# Patient Record
Sex: Female | Born: 1970 | Race: White | Hispanic: No | Marital: Single | Smoking: Current every day smoker
Health system: Southern US, Community
[De-identification: ages and names within clinical notes are randomized; demographics above are authoritative.]

## PROBLEM LIST (undated history)

## (undated) ENCOUNTER — Encounter
Attending: Student in an Organized Health Care Education/Training Program | Primary: Student in an Organized Health Care Education/Training Program

## (undated) ENCOUNTER — Encounter

## (undated) ENCOUNTER — Ambulatory Visit

## (undated) ENCOUNTER — Telehealth

## (undated) ENCOUNTER — Telehealth
Attending: Student in an Organized Health Care Education/Training Program | Primary: Student in an Organized Health Care Education/Training Program

## (undated) ENCOUNTER — Ambulatory Visit: Payer: PRIVATE HEALTH INSURANCE

## (undated) ENCOUNTER — Encounter: Attending: Internal Medicine | Primary: Internal Medicine

## (undated) ENCOUNTER — Encounter
Attending: Pharmacist Clinician (PhC)/ Clinical Pharmacy Specialist | Primary: Pharmacist Clinician (PhC)/ Clinical Pharmacy Specialist

## (undated) ENCOUNTER — Encounter: Attending: Family | Primary: Family

## (undated) ENCOUNTER — Encounter
Payer: PRIVATE HEALTH INSURANCE | Attending: Student in an Organized Health Care Education/Training Program | Primary: Student in an Organized Health Care Education/Training Program

## (undated) ENCOUNTER — Encounter: Payer: PRIVATE HEALTH INSURANCE | Attending: Family | Primary: Family

## (undated) ENCOUNTER — Ambulatory Visit: Payer: PRIVATE HEALTH INSURANCE | Attending: Family | Primary: Family

## (undated) ENCOUNTER — Ambulatory Visit
Attending: Student in an Organized Health Care Education/Training Program | Primary: Student in an Organized Health Care Education/Training Program

## (undated) ENCOUNTER — Telehealth
Attending: Pharmacist Clinician (PhC)/ Clinical Pharmacy Specialist | Primary: Pharmacist Clinician (PhC)/ Clinical Pharmacy Specialist

## (undated) ENCOUNTER — Ambulatory Visit
Attending: Pharmacist Clinician (PhC)/ Clinical Pharmacy Specialist | Primary: Pharmacist Clinician (PhC)/ Clinical Pharmacy Specialist

## (undated) ENCOUNTER — Ambulatory Visit
Payer: PRIVATE HEALTH INSURANCE | Attending: Student in an Organized Health Care Education/Training Program | Primary: Student in an Organized Health Care Education/Training Program

## (undated) ENCOUNTER — Encounter: Attending: Gastroenterology | Primary: Gastroenterology

## (undated) ENCOUNTER — Telehealth: Attending: Family | Primary: Family

## (undated) ENCOUNTER — Inpatient Hospital Stay

## (undated) DIAGNOSIS — T7840XA Allergy, unspecified, initial encounter: Secondary | ICD-10-CM

## (undated) DIAGNOSIS — G4700 Insomnia, unspecified: Secondary | ICD-10-CM

## (undated) DIAGNOSIS — J449 Chronic obstructive pulmonary disease, unspecified: Secondary | ICD-10-CM

## (undated) DIAGNOSIS — F419 Anxiety disorder, unspecified: Secondary | ICD-10-CM

## (undated) DIAGNOSIS — F4310 Post-traumatic stress disorder, unspecified: Secondary | ICD-10-CM

## (undated) DIAGNOSIS — K219 Gastro-esophageal reflux disease without esophagitis: Secondary | ICD-10-CM

## (undated) DIAGNOSIS — J42 Unspecified chronic bronchitis: Secondary | ICD-10-CM

## (undated) HISTORY — DX: Gastro-esophageal reflux disease without esophagitis: K21.9

## (undated) HISTORY — DX: Insomnia, unspecified: G47.00

## (undated) HISTORY — DX: Unspecified chronic bronchitis: J42

## (undated) HISTORY — PX: ABDOMINAL HYSTERECTOMY: SHX81

## (undated) HISTORY — DX: Allergy, unspecified, initial encounter: T78.40XA

---
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
## 2002-02-27 HISTORY — PX: SALPINGOOPHORECTOMY: SHX82

## 2003-02-28 HISTORY — PX: ABDOMINAL HYSTERECTOMY: SHX81

## 2007-07-08 ENCOUNTER — Ambulatory Visit: Payer: Self-pay | Admitting: Internal Medicine

## 2008-08-27 DIAGNOSIS — Z8619 Personal history of other infectious and parasitic diseases: Secondary | ICD-10-CM | POA: Insufficient documentation

## 2008-12-08 ENCOUNTER — Emergency Department: Payer: Self-pay | Admitting: Emergency Medicine

## 2009-06-14 IMAGING — CR NASAL BONES - 3+ VIEW
1 series · 3 of 3 positions shown · non-contrast
Comparison: none

REASON FOR EXAM: Fall
COMMENTS:

PROCEDURE:   MDR NASAL BONES  July 07, 2004   [DATE]
RESULT:     There is a minimally displaced fracture of the anterior aspect
of the nasal bridge. No other fractures are seen. The paranasal sinuses are
clear.

[Series 1: view not recorded · 0.17mm/px · 3 of 3 slices shown]
[im 1/3]
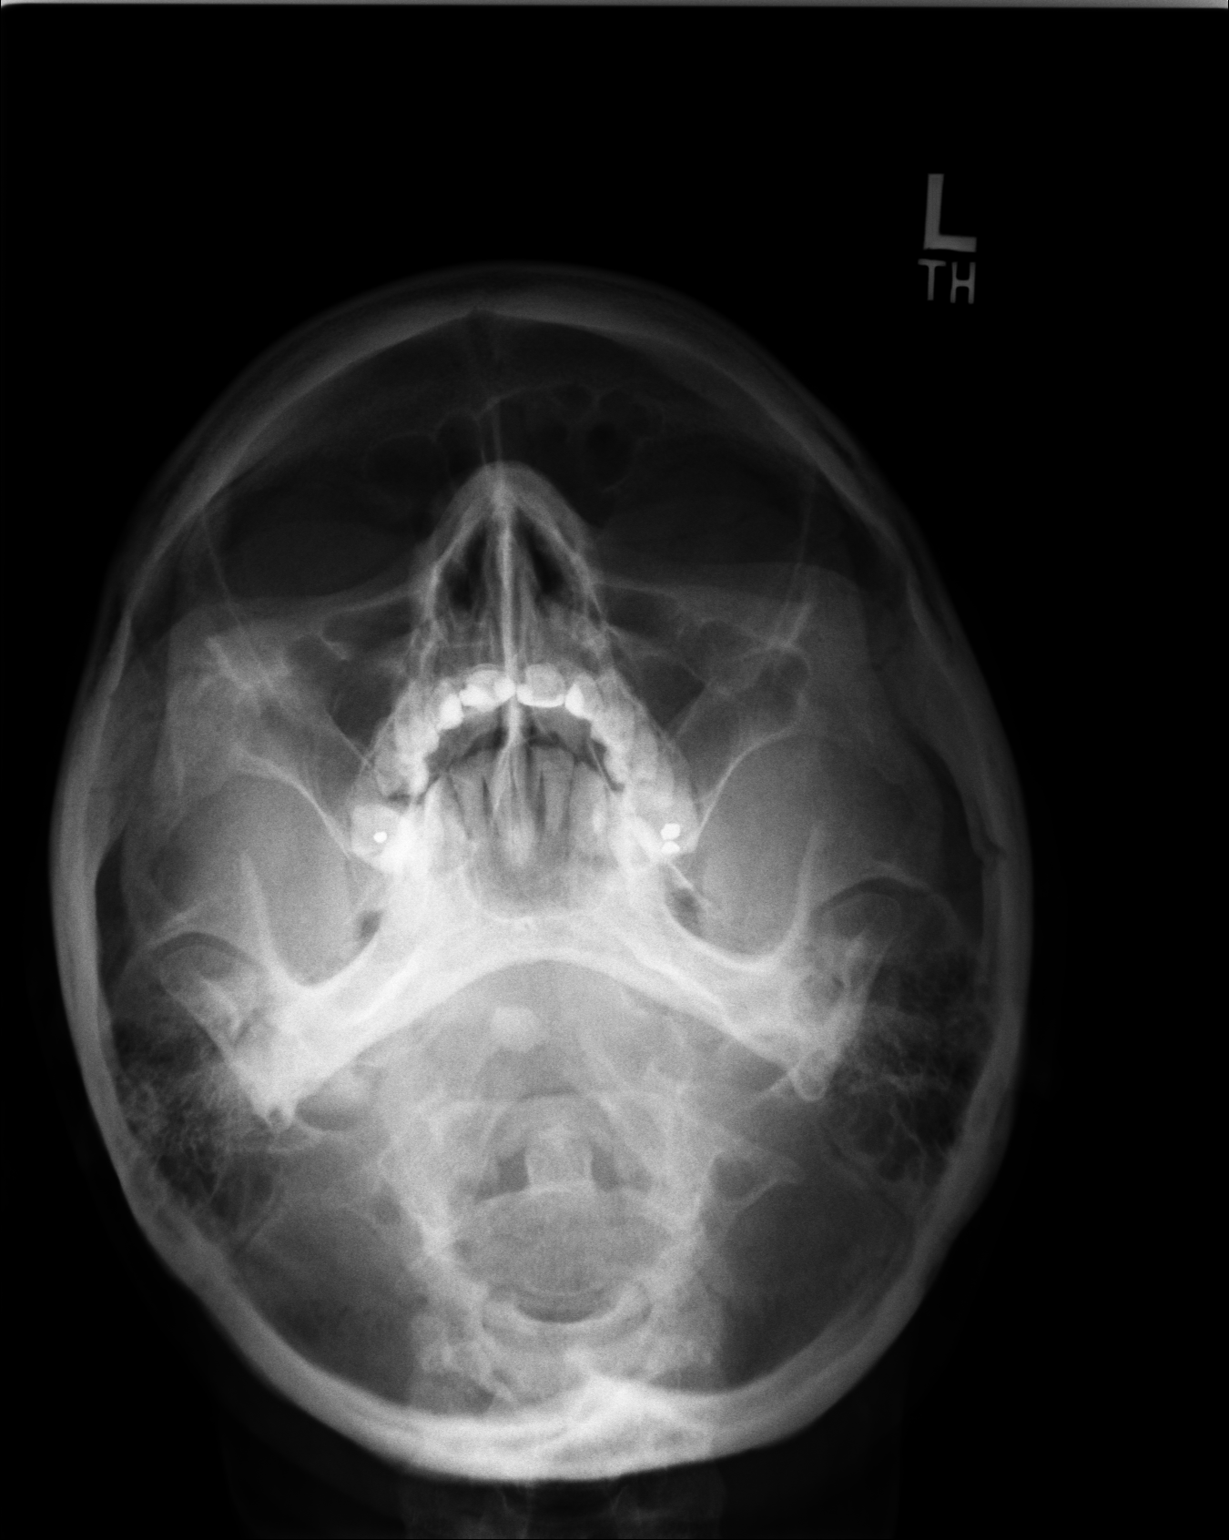
[im 2/3]
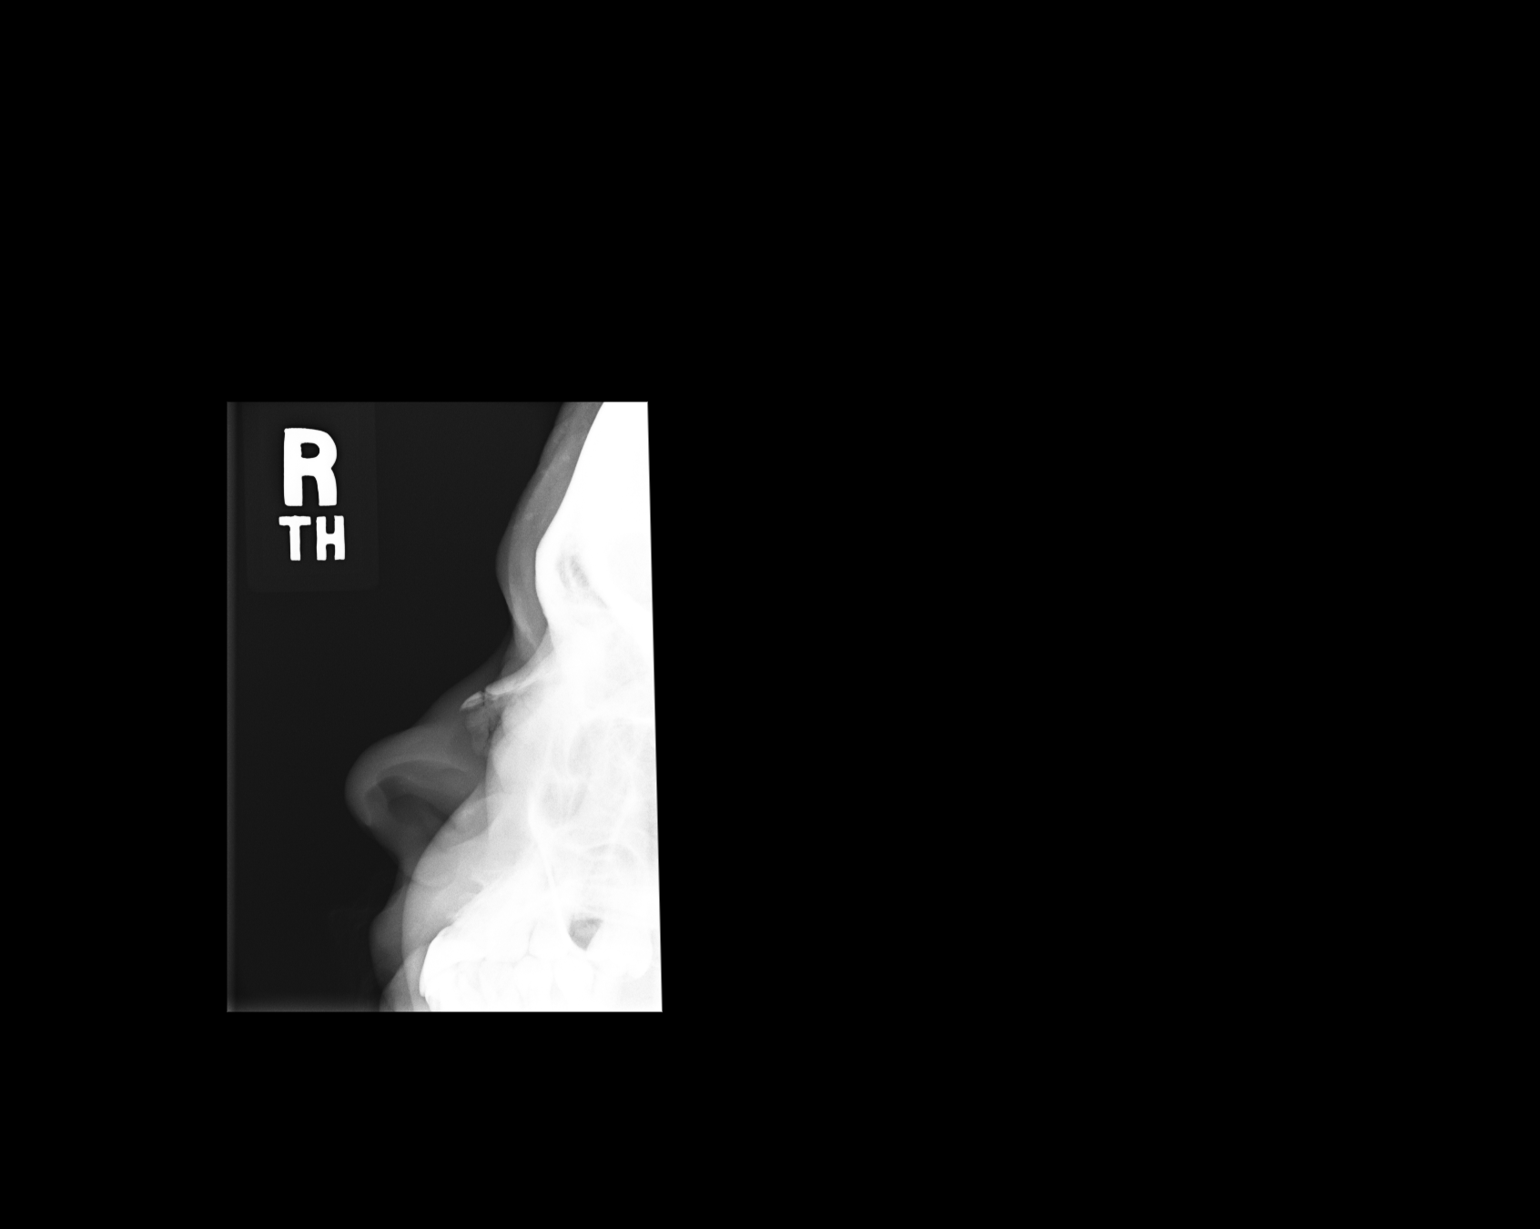
[im 3/3]
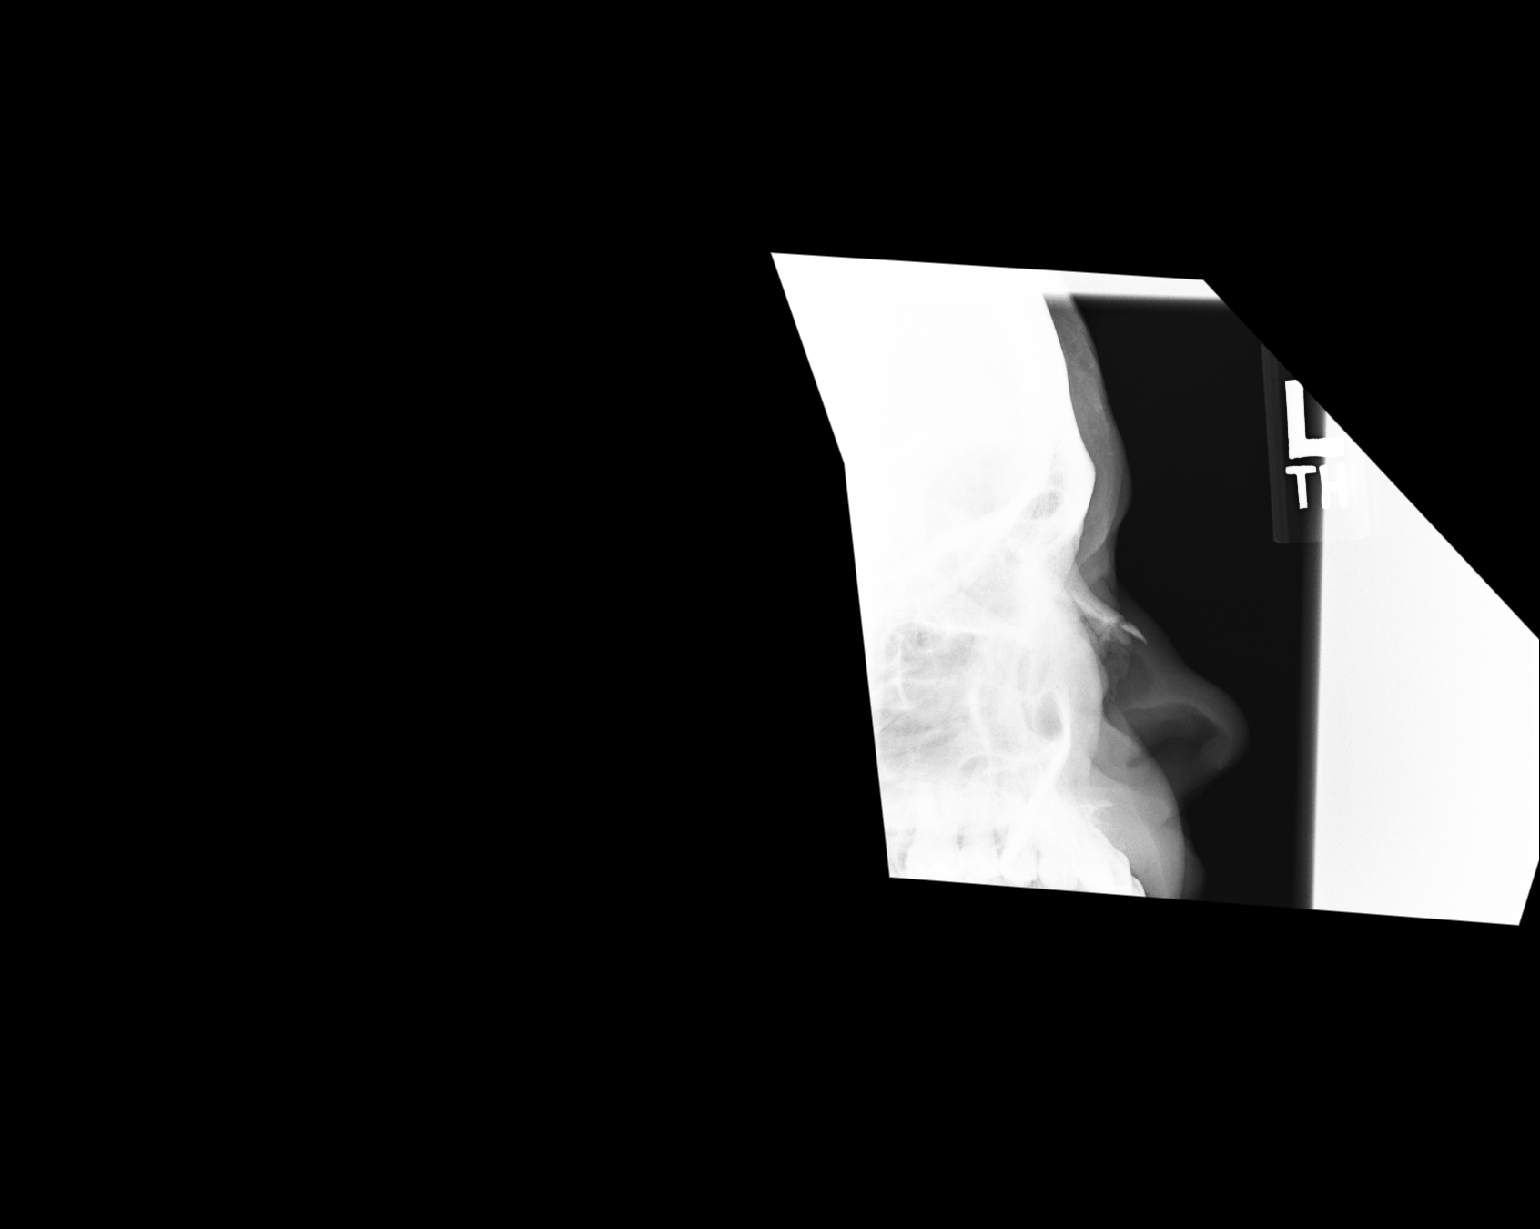

[3 of 3 positions shown; findings below may reference images not displayed]

IMPRESSION: Fracture of the nasal bridge, minimally displaced.

## 2010-04-09 ENCOUNTER — Ambulatory Visit: Payer: Self-pay | Admitting: Internal Medicine

## 2010-07-28 DIAGNOSIS — F32A Depression, unspecified: Secondary | ICD-10-CM | POA: Insufficient documentation

## 2010-07-28 DIAGNOSIS — Z8619 Personal history of other infectious and parasitic diseases: Secondary | ICD-10-CM | POA: Insufficient documentation

## 2010-07-28 DIAGNOSIS — B182 Chronic viral hepatitis C: Secondary | ICD-10-CM | POA: Insufficient documentation

## 2010-07-28 DIAGNOSIS — F17200 Nicotine dependence, unspecified, uncomplicated: Secondary | ICD-10-CM | POA: Insufficient documentation

## 2010-07-28 DIAGNOSIS — K2970 Gastritis, unspecified, without bleeding: Secondary | ICD-10-CM | POA: Insufficient documentation

## 2010-08-09 ENCOUNTER — Ambulatory Visit: Payer: Self-pay | Admitting: Internal Medicine

## 2010-11-12 ENCOUNTER — Ambulatory Visit: Payer: Self-pay | Admitting: Internal Medicine

## 2011-05-08 ENCOUNTER — Ambulatory Visit: Payer: Self-pay

## 2011-05-08 LAB — CBC WITH DIFFERENTIAL/PLATELET
Eosinophil #: 0.1 10*3/uL (ref 0.0–0.7)
Eosinophil %: 2.3 %
Lymphocyte #: 2.2 10*3/uL (ref 1.0–3.6)
Lymphocyte %: 39.7 %
MCH: 31.8 pg (ref 26.0–34.0)
Monocyte #: 0.3 10*3/uL (ref 0.0–0.7)
Monocyte %: 6.2 %
Neutrophil %: 50.6 %
Platelet: 273 10*3/uL (ref 150–440)
RBC: 4.37 10*6/uL (ref 3.80–5.20)
RDW: 11.9 % (ref 11.5–14.5)
WBC: 5.4 10*3/uL (ref 3.6–11.0)

## 2011-05-08 LAB — COMPREHENSIVE METABOLIC PANEL
Alkaline Phosphatase: 108 U/L (ref 50–136)
Anion Gap: 5 — ABNORMAL LOW (ref 7–16)
BUN: 18 mg/dL (ref 7–18)
Creatinine: 1.02 mg/dL (ref 0.60–1.30)
EGFR (African American): >60
EGFR (Non-African Amer.): >60
Glucose: 97 mg/dL (ref 65–99)
Osmolality: 277 (ref 275–301)
SGPT (ALT): 47 U/L
Sodium: 138 mmol/L (ref 136–145)
Total Protein: 7.5 g/dL (ref 6.4–8.2)

## 2011-05-08 LAB — LIPASE, BLOOD: Lipase: 163 U/L (ref 73–393)

## 2011-05-26 DIAGNOSIS — Z9151 Personal history of suicidal behavior: Secondary | ICD-10-CM | POA: Insufficient documentation

## 2011-05-26 DIAGNOSIS — F489 Nonpsychotic mental disorder, unspecified: Secondary | ICD-10-CM | POA: Insufficient documentation

## 2011-05-26 DIAGNOSIS — J449 Chronic obstructive pulmonary disease, unspecified: Secondary | ICD-10-CM | POA: Insufficient documentation

## 2011-05-26 DIAGNOSIS — J439 Emphysema, unspecified: Secondary | ICD-10-CM | POA: Insufficient documentation

## 2011-05-26 DIAGNOSIS — T719XXA Asphyxiation due to unspecified cause, initial encounter: Secondary | ICD-10-CM | POA: Insufficient documentation

## 2012-01-28 ENCOUNTER — Ambulatory Visit: Payer: Self-pay

## 2012-02-10 ENCOUNTER — Emergency Department: Payer: Self-pay | Admitting: Emergency Medicine

## 2012-02-28 HISTORY — PX: SALPINGOOPHORECTOMY: SHX82

## 2012-09-16 DIAGNOSIS — N838 Other noninflammatory disorders of ovary, fallopian tube and broad ligament: Secondary | ICD-10-CM | POA: Insufficient documentation

## 2012-10-23 ENCOUNTER — Ambulatory Visit: Payer: Self-pay | Admitting: Physician Assistant

## 2012-10-23 LAB — URINALYSIS, COMPLETE
Glucose,UR: NEGATIVE mg/dL (ref 0–75)
Ketone: NEGATIVE
Nitrite: NEGATIVE
RBC,UR: >30 /HPF (ref 0–5)
Specific Gravity: 1.02 (ref 1.003–1.030)
WBC UR: >30 /HPF (ref 0–5)

## 2013-02-03 ENCOUNTER — Ambulatory Visit: Payer: Self-pay

## 2014-01-17 IMAGING — CR RIGHT TIBIA AND FIBULA - 2 VIEW
1 series · 2 of 2 positions shown · non-contrast
Comparison: none

REASON FOR EXAM: fall large bruising
COMMENTS:

RESULT:     Images of the right tibia and fibula demonstrate no fracture,
dislocation or radiopaque foreign body. No periosteal elevation or reaction
is seen.

[Series 1: ap · 0.17mm/px · 2 of 2 slices shown]
[im 1/2]
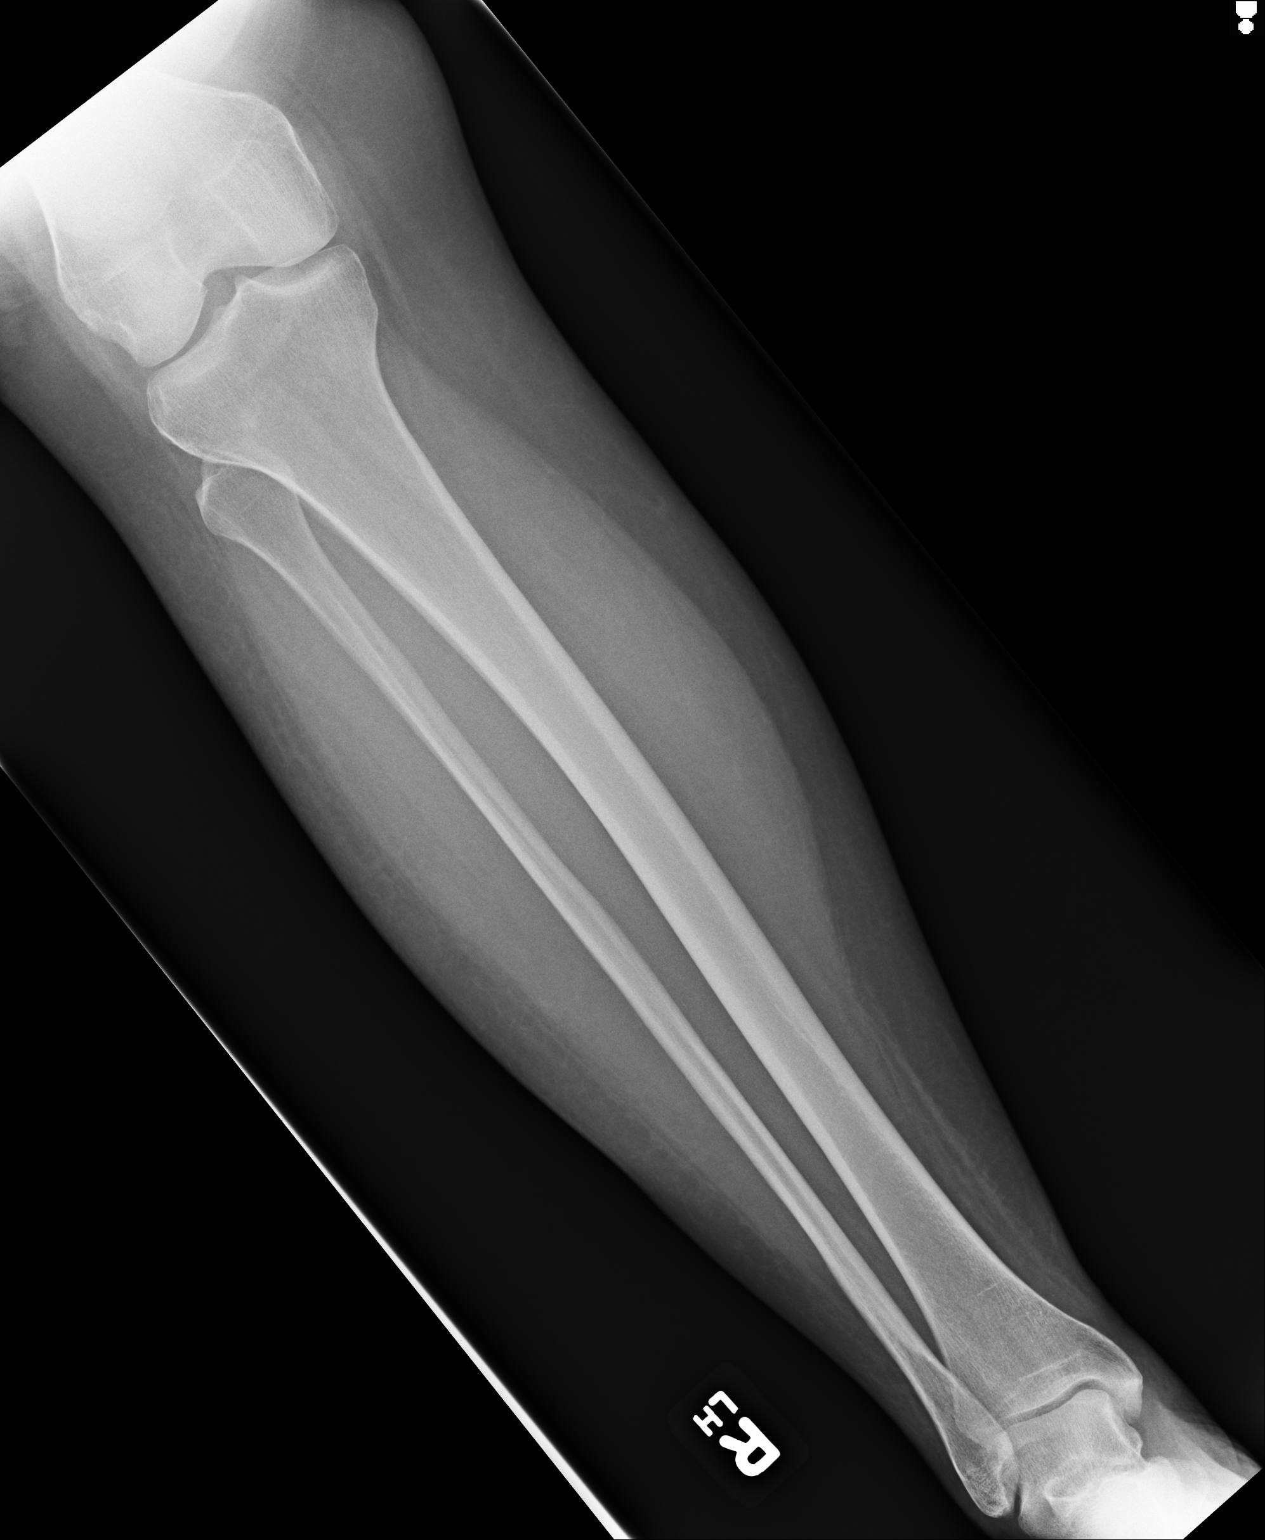
[im 2/2]
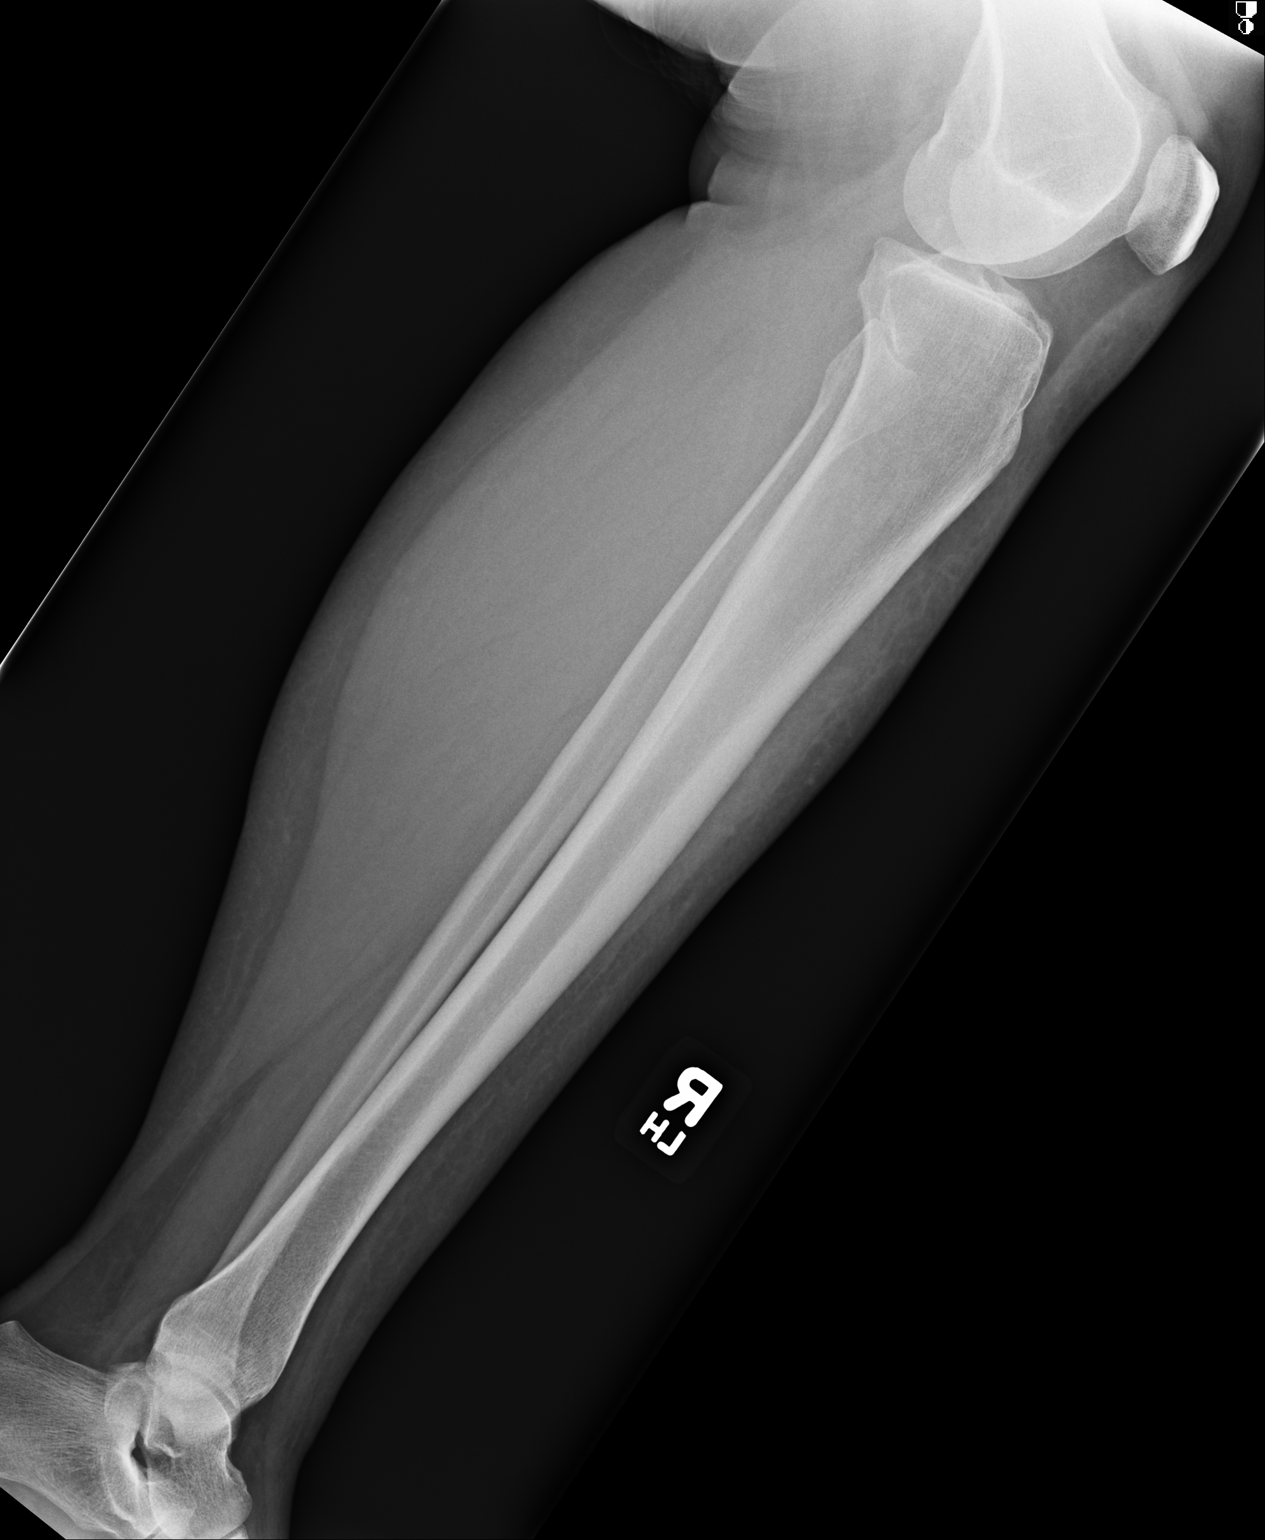

[2 of 2 positions shown; findings below may reference images not displayed]

IMPRESSION: Please see above.

[REDACTED]

## 2014-01-17 IMAGING — CR RIGHT FOOT COMPLETE - 3+ VIEW
1 series · 3 of 3 positions shown · non-contrast
Comparison: none

REASON FOR EXAM: fall
COMMENTS:

PROCEDURE:     DXR - DXR FOOT RT COMPLETE W/OBLIQUES  - February 10, 2012  [DATE]
RESULT:     Images of the right foot show no definite fracture, dislocation
or radiopaque foreign body.

[Series 1: ap · 0.17mm/px · 3 of 3 slices shown]
[im 1/3]
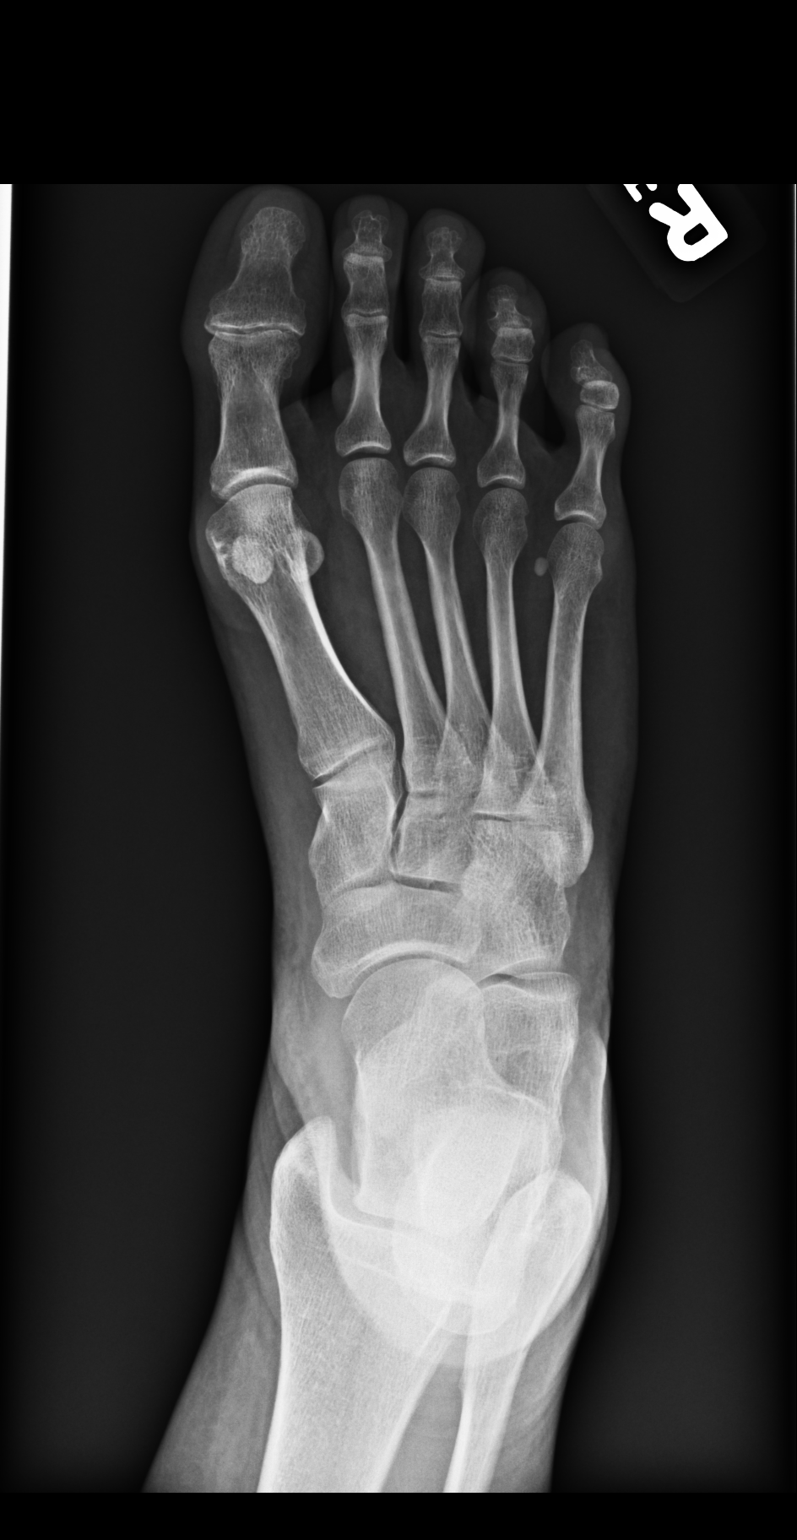
[im 2/3]
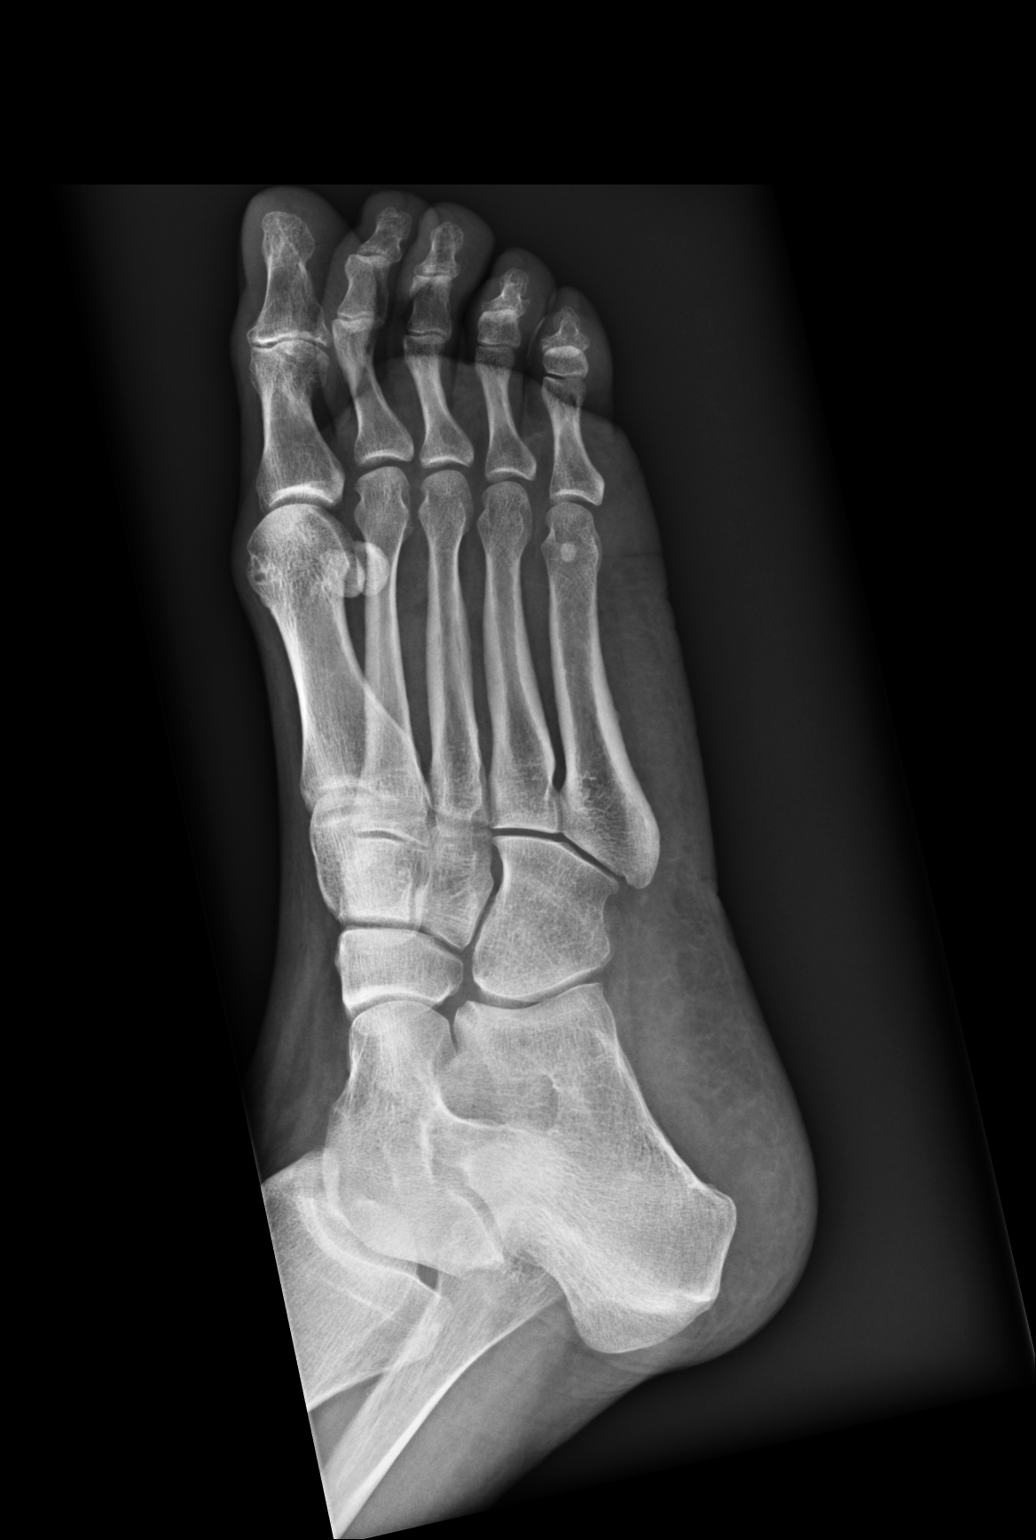
[im 3/3]
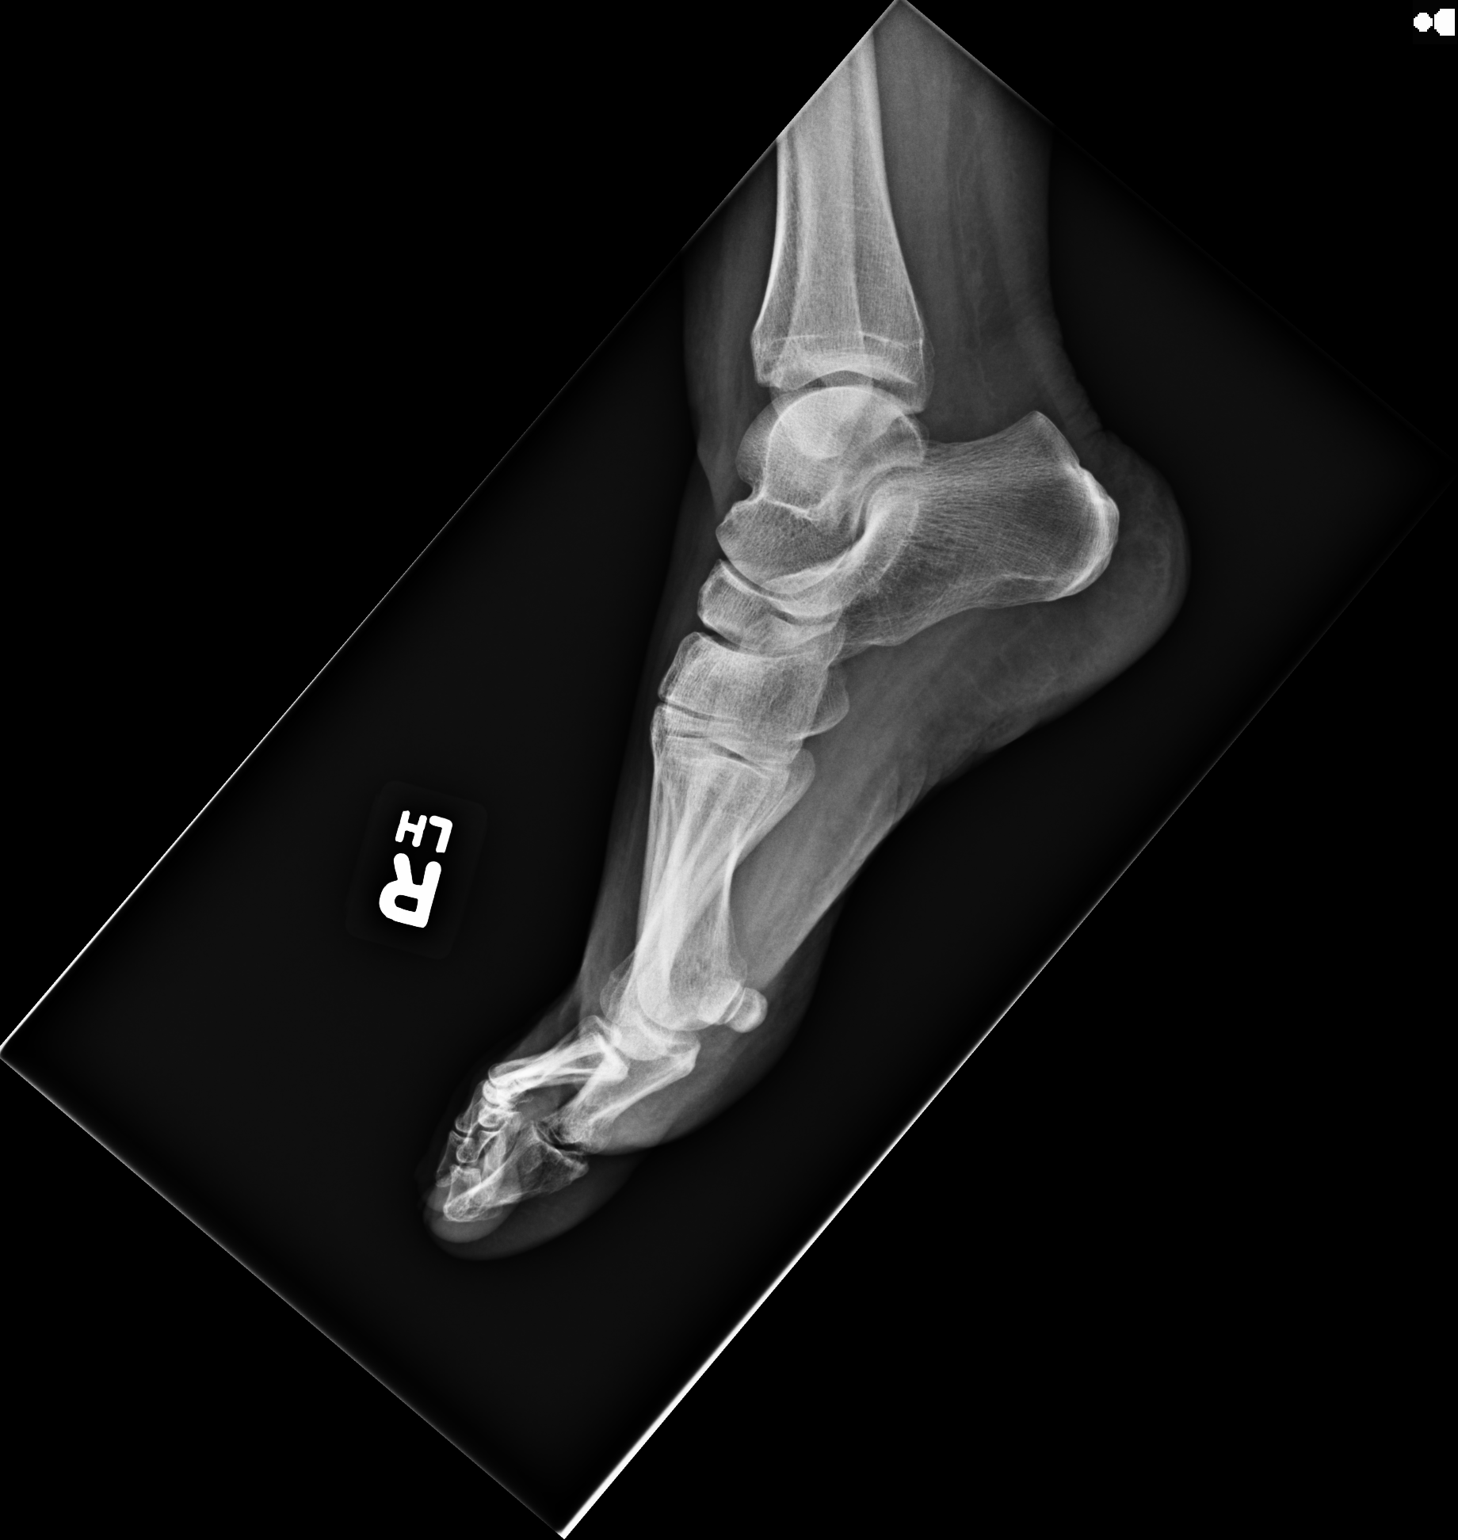

[3 of 3 positions shown; findings below may reference images not displayed]

IMPRESSION: No acute bony abnormality evident.

[REDACTED]

## 2014-08-19 LAB — HM COLONOSCOPY

## 2016-09-11 MED ORDER — RABEPRAZOLE 20 MG TABLET,DELAYED RELEASE
ORAL_TABLET | Freq: Two times a day (BID) | ORAL | 3 refills | 0 days | Status: CP
Start: 2016-09-11 — End: 2018-02-08

## 2016-10-26 ENCOUNTER — Emergency Department
Admission: EM | Admit: 2016-10-26 | Discharge: 2016-10-26 | Disposition: A | Discharge status: Home / Self Care | Payer: MEDICAID | Source: Intra-hospital

## 2016-10-26 ENCOUNTER — Emergency Department
Admission: EM | Admit: 2016-10-26 | Discharge: 2016-10-26 | Disposition: A | Discharge status: Home / Self Care | Source: Intra-hospital

## 2016-10-26 DIAGNOSIS — M549 Dorsalgia, unspecified: Principal | ICD-10-CM

## 2016-10-26 MED ORDER — IBUPROFEN 800 MG TABLET
ORAL_TABLET | Freq: Three times a day (TID) | ORAL | 0 refills | 0.00000 days | Status: CP | PRN
Start: 2016-10-26 — End: 2017-08-22

## 2016-10-26 MED ORDER — LIDOCAINE 5 % TOPICAL PATCH
MEDICATED_PATCH | TRANSDERMAL | 0 refills | 0.00000 days | Status: CP
Start: 2016-10-26 — End: 2017-08-22

## 2016-11-06 MED ORDER — CETIRIZINE 10 MG TABLET
ORAL_TABLET | 11 refills | 0 days | Status: CP
Start: 2016-11-06 — End: 2017-07-19

## 2016-12-04 MED ORDER — IBUPROFEN 600 MG TABLET
ORAL_TABLET | 0 refills | 0 days | Status: CP
Start: 2016-12-04 — End: 2017-03-20

## 2017-03-21 MED ORDER — IBUPROFEN 600 MG TABLET
ORAL_TABLET | Freq: Three times a day (TID) | ORAL | 0 refills | 0 days | Status: CP | PRN
Start: 2017-03-21 — End: 2017-06-04

## 2017-05-01 MED ORDER — CYCLOBENZAPRINE 10 MG TABLET
ORAL_TABLET | 0 refills | 0 days | Status: CP
Start: 2017-05-01 — End: 2017-06-04

## 2017-06-05 MED ORDER — IBUPROFEN 600 MG TABLET
ORAL_TABLET | 0 refills | 0 days | Status: CP
Start: 2017-06-05 — End: 2017-07-22

## 2017-06-05 MED ORDER — CYCLOBENZAPRINE 10 MG TABLET
ORAL_TABLET | 0 refills | 0 days | Status: CP
Start: 2017-06-05 — End: 2017-07-19

## 2017-07-03 ENCOUNTER — Encounter
Admit: 2017-07-03 | Discharge: 2017-07-03 | Payer: PRIVATE HEALTH INSURANCE | Attending: Internal Medicine | Primary: Internal Medicine

## 2017-07-03 ENCOUNTER — Encounter: Admit: 2017-07-03 | Discharge: 2017-07-03 | Payer: PRIVATE HEALTH INSURANCE

## 2017-07-03 DIAGNOSIS — W19XXXD Unspecified fall, subsequent encounter: Secondary | ICD-10-CM

## 2017-07-03 DIAGNOSIS — B1920 Unspecified viral hepatitis C without hepatic coma: Principal | ICD-10-CM

## 2017-07-03 DIAGNOSIS — J449 Chronic obstructive pulmonary disease, unspecified: Secondary | ICD-10-CM

## 2017-07-03 MED ORDER — TIOTROPIUM BROMIDE 18 MCG CAPSULE WITH INHALATION DEVICE
ORAL_CAPSULE | Freq: Every day | RESPIRATORY_TRACT | 3 refills | 0 days | Status: CP
Start: 2017-07-03 — End: ?

## 2017-07-03 MED ORDER — DICLOFENAC 1 % TOPICAL GEL
Freq: Four times a day (QID) | TOPICAL | 0 refills | 0 days | Status: CP
Start: 2017-07-03 — End: 2018-07-03

## 2017-07-19 ENCOUNTER — Encounter
Admit: 2017-07-19 | Discharge: 2017-07-19 | Payer: PRIVATE HEALTH INSURANCE | Attending: Student in an Organized Health Care Education/Training Program | Primary: Student in an Organized Health Care Education/Training Program

## 2017-07-19 DIAGNOSIS — K2990 Gastroduodenitis, unspecified, without bleeding: Secondary | ICD-10-CM

## 2017-07-19 DIAGNOSIS — Z9189 Other specified personal risk factors, not elsewhere classified: Principal | ICD-10-CM

## 2017-07-19 DIAGNOSIS — J302 Other seasonal allergic rhinitis: Secondary | ICD-10-CM

## 2017-07-19 DIAGNOSIS — R1033 Periumbilical pain: Secondary | ICD-10-CM

## 2017-07-19 DIAGNOSIS — K2970 Gastritis, unspecified, without bleeding: Secondary | ICD-10-CM

## 2017-07-19 MED ORDER — PROMETHAZINE 25 MG TABLET
ORAL_TABLET | Freq: Four times a day (QID) | ORAL | prn refills | 0 days | Status: CP | PRN
Start: 2017-07-19 — End: ?

## 2017-07-19 MED ORDER — CYCLOBENZAPRINE 10 MG TABLET
ORAL_TABLET | Freq: Every evening | ORAL | 0 refills | 0.00000 days | Status: CP | PRN
Start: 2017-07-19 — End: ?

## 2017-07-19 MED ORDER — CETIRIZINE 10 MG TABLET
ORAL_TABLET | Freq: Every day | ORAL | 11 refills | 0 days | Status: CP
Start: 2017-07-19 — End: 2018-09-27

## 2017-08-08 MED ORDER — IBUPROFEN 600 MG TABLET
ORAL_TABLET | 0 refills | 0 days | Status: CP
Start: 2017-08-08 — End: ?

## 2017-08-09 MED ORDER — IBUPROFEN 600 MG TABLET
ORAL_TABLET | 0 refills | 0 days | Status: CP
Start: 2017-08-09 — End: 2017-08-22

## 2017-08-22 ENCOUNTER — Encounter
Admit: 2017-08-22 | Discharge: 2017-08-22 | Payer: PRIVATE HEALTH INSURANCE | Attending: Gastroenterology | Primary: Gastroenterology

## 2017-08-22 DIAGNOSIS — B1920 Unspecified viral hepatitis C without hepatic coma: Principal | ICD-10-CM

## 2017-08-22 NOTE — Unmapped (Signed)
[facility]    Alba Destine, M.D.  Professor of Medicine  Director, [facility]  [place] [facility] [place] [facility] [place]    [phone]    Brianne Maina Lamar Blinks, MD  [address]  [id number]  [place], [place] [address]     Chief complaint: Patient referred for consultation for chronic hepatitis C genotype 3    Present illness: Patient is a 47 y.o. Caucasian female with chronic hepatitis C that she states was diagnosed approximately 30 years ago.  She went to donate blood and was told that she was positive for hepatitis C.  Over the years she has never been treated.  Currently the patient complains of fatigue.  She also has multiple somatic complaints including bone pain, chest pain, abdominal pain, pain over her liver,.    10 system review of systems is as noted above.  She has multiple somatic complaints.  She notes a bruise on her left breast.    Past medical history:    FIBROSCAN 07/2017: 5.0 KPS c/w stage F1 mild fibrosis    Active Ambulatory Problems     Diagnosis Date Noted   ??? 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 Chronic hepatitis C (CMS-HCC) 07/28/2010   ??? Chronic airway obstruction (CMS-HCC) 05/26/2011   ??? Depressive disorder 07/28/2010   ??? Esophageal reflux 07/28/2010   ??? Suicide and self-inflicted suffocation by other specified means 05/26/2011   ??? Tobacco use disorder 07/28/2010   ??? Gastritis and gastroduodenitis 07/14/2011   ??? At risk for long QT syndrome 07/19/2017   ??? Non-intractable vomiting with nausea 07/19/2017   ??? Difficult or painful urination 11/20/2012   ??? Follow-up examination after gynecological surgery 11/20/2012   ??? Ovarian mass 09/16/2012     Resolved Ambulatory Problems     Diagnosis Date Noted   ??? No Resolved Ambulatory Problems     Past Medical History:   Diagnosis Date   ??? Abnormal mammogram    ??? Alcohol abuse    ??? COPD (chronic obstructive pulmonary disease) (CMS-HCC)    ??? Hepatitis C    ??? Substance abuse (CMS-HCC)      No Known Allergies Current Outpatient Medications   Medication Sig Dispense Refill   ??? albuterol (PROAIR HFA) 90 mcg/actuation inhaler Inhale 2 puffs every six (6) hours as needed for wheezing or shortness of breath. 1 Inhaler 5   ??? ALPRAZolam (NIRAVAM) 1 MG dissolvable tablet Take 1 mg by mouth Three (3) times a day as needed for anxiety.     ??? cetirizine (ZYRTEC) 10 MG tablet Take 1 tablet (10 mg total) by mouth daily. TAKE 1 TABLET(10 MG) BY MOUTH DAILY 30 tablet 11   ??? cyclobenzaprine (FLEXERIL) 10 MG tablet Take 1 tablet (10 mg total) by mouth nightly as needed for muscle spasms. 30 tablet 0   ??? diclofenac sodium (VOLTAREN) 1 % gel Apply 2 g topically Four (4) times a day. 100 g 0   ??? fluticasone-salmeterol (ADVAIR DISKUS) 100-50 mcg/dose diskus Inhale 1 puff Two (2) times a day. 180 each 3   ??? ibuprofen (ADVIL,MOTRIN) 600 MG tablet TAKE 1 TABLET(600 MG) BY MOUTH EVERY 8 HOURS AS NEEDED FOR PAIN 60 tablet 0   ??? promethazine (PHENERGAN) 25 MG tablet Take 1 tablet (25 mg total) by mouth every six (6) hours as needed for nausea. 30 tablet prn   ??? RABEprazole (ACIPHEX) 20 mg tablet Take 1 tablet (20 mg total) by  mouth Two (2) times a day. 180 tablet 3   ??? tiotropium (SPIRIVA WITH HANDIHALER) 18 mcg inhalation capsule Place 1 capsule (18 mcg total) into inhaler and inhale once daily. 90 capsule 3   ??? traZODone (DESYREL) 100 MG tablet Take 150 mg by mouth. Frequency:PHARMDIR   Dosage:150   MG  Instructions:  Note:May take one tablet at bedtime as needed for insomnia Dose: 150 MG       No current facility-administered medications for this visit.      Social history: The patient is not married.  She has not had does not have any children.  She smokes half pack cigarettes per day.  She drinks alcohol one time per month.  She currently is not working on disability for variety of medical conditions.    Family history: Negative for liver disease or liver cancer although her father drank alcohol.    BP 121/86  - Pulse 93  - Temp 37 ??C (98.6 ??F) (Oral)  - Resp 18  - Ht 170.2 cm (5' 7.01)  - Wt 83.2 kg (183 lb 8 oz)  - SpO2 96%  - BMI 28.73 kg/m??     Pleasant individual in NAD    HEENT: Sclera are anicteric, no temporal muscle loss, oropharynx is negative  NECK: No thyromegaly or lymphadenopathy, No carotid bruits  Chest: Clear to auscultation and percussion  Heart: S1, S2, RR, No murmurs  Abdomen: Soft, non-tender, non-distended, no hepatosplenomegaly, no masses appreciated, no ascites, Healed lower abdominal scar.  Skin: No spider angiomata, No rashes  Extremities: Without pedal edema, +palmar erythema  Neuro: Grossly intact, No focal deficits    Labs reviewed in Epic.    Pression:  1.  Chronic hepatitis C genotype 3 of long-standing duration.  Patient does have stigmata of chronic liver disease so it is possible she has underlying advanced fibrosis or cirrhosis. Today we discussed the natural history of HCV infection, including the risks for progression to cirrhosis, liver failure, liver cancer, and risks of hepatocellular carcinoma. Patient is aware of the need for continued follow-up and monitoring.  We discussed the importance of remaining abstinent from alcohol due to additive effects on disease progression to cirrhosis.  We discussed potential treatment options that include all-oral regimens with low rates of side effects and high rates of cure (sustained virological response).  Today she will have FibroScan to assess the extent of hepatic fibrosis. ADDENDUM: FIBROSCAN ONLY F1 MILD FIBROSIS    The patient will meet with our pharmacist, Erskine Squibb, to review treatment options for hepatitis C.    2.  Vaccination for hepatitis A and hepatitis B: Patient does not recall whether she is been vaccinated.  We will check serologies and vaccinate appropriately at the time of her next visit.    3.  Patient states that she had a bruise on her left breast.  This was not examined.  She will follow-up with her primary physician for further evaluation.      Patient will return for follow-up in 3 months or sooner once hepatitis C medications have been started to see Owens Shark, DNP.    Alba Destine, M.D.  Professor of Medicine  Director, [facility]  [place] [facility] [place] [facility] [place]    [phone]

## 2017-08-22 NOTE — Unmapped (Addendum)
Chronic hepatitis C. You will be a good candidate for treatment with medications that have a high rate of cure and a low risk of side effects. Labs and FIBROSCAN today. Liver ultrasound scheduled for a different day. You will meet our pharmacist Erskine Squibb to review starting therapy. Return to office in 3 months or sooner to see [facility], once hepatitis C medications are started.  We discussed importance of weight loss to maintain liver health.

## 2017-08-22 NOTE — Unmapped (Signed)
Counseling for HCV treatment     B18.2 Hep C: yes    K74.60 Cirrhosis: no,   Child Pugh Score if applicable and for Medicaid pts: 5  Z94.4 Liver Transplant: no    Genotype: 3 (07/03/17)  HCV RNA: 0,865,784 IU/ml on 07/03/17   Fibrosis score: F0 (5.0 kPa) on 08/22/17  HIV Co-infection? no  Signs of liver decompensation? no  Previous treatment? naive    Planned regimen: Mavyret (glecaprevir/pibrentasvir 100/40 mg) 3 tabs daily x 8 weeks  Urgency: Routine Request    Prescribing Provider/NPI: Dr. Gavin Potters / [id number]  Please see Media tab under Chart Review on date 08/22/17 for patient signature/waiver forms.  Insurance: Medicaid    Amy Leach is 47 y.o. Caucasian female who presents to clinic alone and is interested in starting treatment with Mavyret. We discussed the prior authorization (PA) process of obtaining the medication through insurance and that this may take some time.  Amy Leach, Friend provided transportation for today's appointment.     Current medications:  Current Outpatient Medications   Medication Sig Dispense Refill   ??? albuterol (PROAIR HFA) 90 mcg/actuation inhaler Inhale 2 puffs every six (6) hours as needed for wheezing or shortness of breath. 1 Inhaler 5   ??? ALPRAZolam (NIRAVAM) 1 MG dissolvable tablet Take 1 mg by mouth Three (3) times a day as needed for anxiety.     ??? cetirizine (ZYRTEC) 10 MG tablet Take 1 tablet (10 mg total) by mouth daily. TAKE 1 TABLET(10 MG) BY MOUTH DAILY 30 tablet 11   ??? cyclobenzaprine (FLEXERIL) 10 MG tablet Take 1 tablet (10 mg total) by mouth nightly as needed for muscle spasms. 30 tablet 0   ??? diclofenac sodium (VOLTAREN) 1 % gel Apply 2 g topically Four (4) times a day. 100 g 0   ??? fluticasone-salmeterol (ADVAIR DISKUS) 100-50 mcg/dose diskus Inhale 1 puff Two (2) times a day. 180 each 3   ??? ibuprofen (ADVIL,MOTRIN) 600 MG tablet TAKE 1 TABLET(600 MG) BY MOUTH EVERY 8 HOURS AS NEEDED FOR PAIN 60 tablet 0   ??? promethazine (PHENERGAN) 25 MG tablet Take 1 tablet (25 mg total) by mouth every six (6) hours as needed for nausea. 30 tablet prn   ??? RABEprazole (ACIPHEX) 20 mg tablet Take 1 tablet (20 mg total) by mouth Two (2) times a day. 180 tablet 3   ??? tiotropium (SPIRIVA WITH HANDIHALER) 18 mcg inhalation capsule Place 1 capsule (18 mcg total) into inhaler and inhale once daily. 90 capsule 3   ??? traZODone (DESYREL) 100 MG tablet Take 150 mg by mouth. Frequency:PHARMDIR   Dosage:150   MG  Instructions:  Note:May take one tablet at bedtime as needed for insomnia Dose: 150 MG       No current facility-administered medications for this visit.          Following topics were discussed during counseling:    1. Indications for medication, dosage and administration.     A. Mavyret (100/40 mg) 3 tablets to take daily with food.    2. Common side effects of medications and management strategies. (fatigue, headache)    3. Importance of adherence to regimen, follow-up clinic visits and lab monitoring.     A.Asked patient to call [place] [facility] [phone] before starting treatment to establish start date and to schedule TW#4 appointment.    4. Drug-drug interaction.    A. Current medications have been reviewed and assessed for possible interaction. Denies use of herbal medication  such as milk thistle or St. John's wart.  Allergies have been verified. Endorses seldome alcohol. (once every 2 months) Recommended abstinence. Pt in agreement. Endorses smoking hemp.   5. Importance of informing pharmacy and clinic of updated contact information.  Discussed the process of obtaining medication through specialty pharmacy and when approved medication will be delivered to patient's home. Stressed importance of being able to be reached over the phone.   6. Med access: Beneficiary Readiness Evaluation form signed during clinic.  Pt will need TW#4 HCV RNA submitted to Medicaid to get full treatment duration (Continuation PA) approved. Stressed importance of TW#4 visit to patient. Patient verbalized understanding. Provided contact information for any questions/concerns.       Park Breed, Pharm D., BCPS, BCGP, CPP  [facility]  [address]  [place], [place] [address]  [phone]    This portion of the visit was 15 minutes in duration and greater than 50% was spend in direct counseling and coordination of care regarding hepatitis C medication management.

## 2017-08-22 NOTE — Unmapped (Addendum)
[facility]  FAST ??? Fibrosis Assessment Team  Division of Gastroenterology and Hepatology  ??  ??    ??  FIBROSCAN will be performed to assess hepatic fibrosis (scarring) in order to stage this patient's liver disease. This will assist with evaluating the natural course of the disease and will provide important information regarding prognosis, duration of therapy, and potential response to treatment. This information will also help assess risk for hepatocellular carcinoma and need for liver cancer surveillance.??  ??  FibroscanProcedure:   After obtaining verbal consent, the patient was placed in a supine position. Physical characteristics and landmarks were assessed to establish appropriate mid-axillary intercostal space for probe placement. 50Hz  Shear Wave pulses were applied and the resulting Shear Wave and Propagation Speed detected with a 3.5 MHz ultrasonic signal, using the FibroScan probe.  Skin to liver capsule distance and liver parenchyma were accessed during the entire examination with the FibroScan probe. The patient was instructed to breathe normally and to abstain from sudden movements during the procedure, allowing for random measurements of liver stiffness. At least ten Shear Waves were produced; individual measurements of each Shear Wave were calculated. Patient tolerated the procedure well and was discharged without incident.  ??  Probe used []  M+    Serial # [id number]                         [x]  XL+   Serial # [id number]  ??  Main Etiology of Liver Disease:  [x] HCV     [] HBV   [] Alcohol    [] NASH  [] PBC      [] PSC     [] Other________________  ??  50Hz  shear wave pulses were applied and the resulting shear wave and propagation speed detected with a 3.5 MHz ultrasonic signal, using the FibroScan probe.     ??  At least ten Shear Waves were produced; individual measurements of each shear wave were calculated.    ??  Patient tolerated the procedure well and was discharged without incident.  ??  Fibroscan score: ___5.0___kPa  ??  IQR:                       ___18___%  ??  Test performed by: Luiz Ochoa, RN  ??  [facility]  FAST ??? Fibrosis Assessment Team  Division of Gastroenterology and Hepatology  ??  ??    ??  ??  ??  Estimation of the stage of liver fibrosis (Metavir Score):  The results of the Liver Stiffness Score are consistent with the following liver fibrosis stage:  ??  ??  [x]  F0-F1             []  F2               []  F3               []  F4  ??  ??  GENERAL RECOMMENDATIONS ACCORDING TO THE STAGE OF LIVER FIBROSIS.  ??  F0-F1: No-minimal fibrosis. The risk of progression to advanced fibrosis and cirrhosis is low. If the cause of liver disease is not removed, a 1-2 yr follow-up study is recommended.   F2: Significant fibrosis. There is a moderate risk of progression to cirrhosis. If the cause of liver disease is not removed, a follow-up study in 12 months is recommended.  F3: Advanced (pre-cirrhotic stage). The risk of progression to cirrhosis is high. Imaging studies to rule out hepatocellular carcinoma  should be considered. Efforts to remove the cause of liver disease are highly recommended.  F4: Cirrhosis. There is significant risk of portal hypertension and esophageal varices. An upper endoscopy is recommended. Imaging studies for hepatocellular carcinoma screening are recommended.   ??  Any and all FibroScan studies must be carefully evaluated, taking fully into account all individual measurement/scans, patient history and other factors.  As with liver biopsy, any estimation of liver fibrosis may be subject to under or over staging due to sampling error.  Any further medical or surgical intervention should be made only while fully considering the circumstances of this patient and in consultation with this patient.    I have reviewed and interpreted the FIBROSCAN test results as described above.

## 2017-08-23 LAB — HEPATITIS B SURFACE ANTIBODY: HEPATITIS B SURFACE ANTIBODY QUANT: 9.96 m[IU]/mL — ABNORMAL HIGH (ref <8.00)

## 2017-08-23 LAB — HEPATITIS B CORE TOTAL ANTIBODY: Hepatitis B virus core Ab:PrThr:Pt:Ser/Plas:Ord:IA: REACTIVE — AB

## 2017-08-23 LAB — HEPATITIS A IGG
HEPATITIS A IGG: REACTIVE — AB
Hepatitis A virus Ab.IgG:PrThr:Pt:Ser:Ord:: REACTIVE — AB

## 2017-08-23 LAB — HEPATITIS B CORE ANTIBODY, TOTAL: HEPATITIS B CORE TOTAL ANTIBODY: REACTIVE — AB

## 2017-08-23 LAB — HEPATITIS B SURFACE ANTIGEN: Hepatitis B virus surface Ag:PrThr:Pt:Ser:Ord:: NONREACTIVE

## 2017-08-23 LAB — HIV ANTIGEN/ANTIBODY COMBO: HIV 1+2 Ab+HIV1 p24 Ag:PrThr:Pt:Ser/Plas:Ord:IA: NONREACTIVE

## 2017-08-28 MED ORDER — GLECAPREVIR 100 MG-PIBRENTASVIR 40 MG TABLET: 3 | tablet | 1 refills | 0 days

## 2017-08-28 MED ORDER — GLECAPREVIR 100 MG-PIBRENTASVIR 40 MG TABLET
ORAL_TABLET | Freq: Every day | ORAL | 1 refills | 0.00000 days | Status: CP
Start: 2017-08-28 — End: 2017-08-28

## 2017-08-28 MED ORDER — GLECAPREVIR 100 MG-PIBRENTASVIR 40 MG TABLET: 3 | carton | Freq: Every day | 1 refills | 0 days | Status: AC

## 2017-08-28 NOTE — Unmapped (Signed)
Please eprescribe teed up medication to pharmacy on encounter.  Please let me know if I can do anything to help you.  Thank you.

## 2017-08-28 NOTE — Unmapped (Signed)
Per test claim for MAVYRET 100-40MG  TABLET at the [facility], patient needs Medication Assistance Program for Prior Authorization.

## 2017-08-29 NOTE — Unmapped (Signed)
Initial Counseling for HCV treatment     Planned regimen: Mavyret (glecaprevir/pibrentasvir 100/40 mg) 3 tabs daily x 8 weeks  Planned start date: 09/04/17    Pharmacy: [facility] [phone] option #4    PMH:   Past Medical History:   Diagnosis Date   ??? Abnormal mammogram    ??? Alcohol abuse    ??? COPD (chronic obstructive pulmonary disease) (CMS-HCC)    ??? Hepatitis C    ??? Substance abuse (CMS-HCC)      Current Meds:   Current Outpatient Medications   Medication Sig Dispense Refill   ??? albuterol (PROAIR HFA) 90 mcg/actuation inhaler Inhale 2 puffs every six (6) hours as needed for wheezing or shortness of breath. 1 Inhaler 5   ??? ALPRAZolam (NIRAVAM) 1 MG dissolvable tablet Take 1 mg by mouth Three (3) times a day as needed for anxiety.     ??? cetirizine (ZYRTEC) 10 MG tablet Take 1 tablet (10 mg total) by mouth daily. TAKE 1 TABLET(10 MG) BY MOUTH DAILY 30 tablet 11   ??? cyclobenzaprine (FLEXERIL) 10 MG tablet Take 1 tablet (10 mg total) by mouth nightly as needed for muscle spasms. 30 tablet 0   ??? diclofenac sodium (VOLTAREN) 1 % gel Apply 2 g topically Four (4) times a day. 100 g 0   ??? fluticasone-salmeterol (ADVAIR DISKUS) 100-50 mcg/dose diskus Inhale 1 puff Two (2) times a day. 180 each 3   ??? glecaprevir-pibrentasvir (MAVYRET) 100-40 mg tablet Take 3 tablets by mouth daily. Take with food. 1 4-week carton 1   ??? ibuprofen (ADVIL,MOTRIN) 600 MG tablet TAKE 1 TABLET(600 MG) BY MOUTH EVERY 8 HOURS AS NEEDED FOR PAIN 60 tablet 0   ??? promethazine (PHENERGAN) 25 MG tablet Take 1 tablet (25 mg total) by mouth every six (6) hours as needed for nausea. 30 tablet prn   ??? RABEprazole (ACIPHEX) 20 mg tablet Take 1 tablet (20 mg total) by mouth Two (2) times a day. 180 tablet 3   ??? tiotropium (SPIRIVA WITH HANDIHALER) 18 mcg inhalation capsule Place 1 capsule (18 mcg total) into inhaler and inhale once daily. 90 capsule 3   ??? traZODone (DESYREL) 100 MG tablet Take 150 mg by mouth. Frequency:PHARMDIR Dosage:150   MG  Instructions:  Note:May take one tablet at bedtime as needed for insomnia Dose: 150 MG       No current facility-administered medications for this visit.      Patient is ready to start Mavyret.    Following topics were discussed during counseling:   Patient Counseling    Counseled the patient on the following:  doses and administration discussed, safe handling, storage, and disposal discussed, possible adverse effects and management discussed, possible drug and prescription drug interactions discussed, possible drug and OTC drug and food interactions discussed, lab monitoring and follow-up discussed, cost of medications and cost implications discussed, adherence and missed doses discussed, pharmacy contact information discussed        1. Indications for medication, dosage and administration.     A. Mavyret (100/40 mg) 3 tablets to take daily with food. Patient plans to take in the afternoons between 6-7pm. Patient reports this is the time she eats most consistently.     2. Common side effects of medications and management strategies. (fatigue, headache)      3. Importance of adherence to regimen, follow-up clinic visits and lab monitoring.   Medication Adherence    Demonstrates understanding of importance of adherence:  yes  Informant:  patient  Patient is at risk for Non-Adherence:  No  Reasons for non-adherence:  no problems identified  Confirmed plan for next specialty medication refill:  delivery by pharmacy       A. Asked patient to call [facility] [phone] to establish start date for treatment and to schedule appointment 4 weeks before starting treatment.      4. Drug-drug interaction.  Drug Interactions    Drug interactions evaluated:  yes  Clinically relevant drug interactions identified:  no       A. Current medications have been reviewed and assessed for possible interaction.    Denies use of herbal medication such as milk thistle or St. John's wart.  Allergies have been verified. Denies alcohol.     5. Importance of informing pharmacy and clinic of updated contact information.  Stressed importance of being able to reach over the phone to set up refills. Advised patient to call pharmacy when down to about 7 day supply left to ensure there's no interruption in therapy.  Refill Coordination    Has the Patients' Contact Information Changed:  No  Is the Shipping Address Different:  No       Shipping Information    Delivery Scheduled:  Yes  Delivery Date:  09/04/17       Patient verbalized understanding. Provided contact information for any questions/concerns.       Park Breed, Pharm D., BCPS, BCGP, CPP  [facility]  [address]  [place], [place] [address]  [phone]      August 29, 2017 2:27 PM

## 2017-09-01 MED FILL — MAVYRET/100-40MG/TABS: MAVYRET/100-40MG/TABS | 28 days supply | Qty: 84 | Fill #0

## 2017-09-11 ENCOUNTER — Encounter: Admit: 2017-09-11 | Discharge: 2017-09-12 | Payer: PRIVATE HEALTH INSURANCE

## 2017-09-11 DIAGNOSIS — B1920 Unspecified viral hepatitis C without hepatic coma: Principal | ICD-10-CM

## 2017-09-18 NOTE — Unmapped (Signed)
Patient called asking about her recent ultrasound results.     Will check on the results and call her back.

## 2017-09-20 NOTE — Unmapped (Signed)
[facility] Refill Coordination Note    Specialty Medication(s) to be Shipped:   Infectious Disease: Mavyret    Other medication(s) to be shipped:      Amy Leach, DOB: 11/30/1970  Phone: [phone] (home)   Shipping Address: [address]  [place] [address]    All above HIPAA information was verified with patient.     Completed refill call assessment today to schedule patient's medication shipment from the [facility] [phone]).       Specialty medication(s) and dose(s) confirmed: Regimen is correct and unchanged.   Changes to medications: Madisyn reports no changes reported at this time.  Changes to insurance: No  Questions for the pharmacist: No    The patient will receive an FSI print out for each medication shipped and additional FDA Medication Guides as required.  Patient education from [employer] or Robet Leu may also be included in the shipment.    DISEASE/MEDICATION-SPECIFIC INFORMATION     **I have not done a Hep C refill call before. Didn't know it needed specific information. No one was around to ask. Forwarding to [employer].    ADHERENCE     Medication Adherence    Patient reported X missed doses in the last month:  0  Specialty Medication:  MAVYRET 100-40 #OH 13 DAYS  Patient is on additional specialty medications:  No  Patient is on more than two specialty medications:  No  Demonstrates understanding of importance of adherence:  yes  Informant:  patient  Reliability of informant:  reliable  Patient is at risk for Non-Adherence:  No  Confirmed plan for next specialty medication refill:  delivery by pharmacy  Refills needed for supportive medications:  not needed          Refill Coordination    Has the Patients' Contact Information Changed:  No  Is the Shipping Address Different:  No         MEDICARE PART B DOCUMENTATION     Not Applicable    SHIPPING     Shipping address confirmed in FSI.     Delivery Scheduled: Yes, Expected medication delivery date: 09/25/17 via UPS or courier.     Lajean Silvius   [facility] Specialty Technician

## 2017-09-20 NOTE — Unmapped (Signed)
Reviewed with technician that took the call how to proceed in the future  Patient appears to be on treatment week 2  Quantity on hand was verified on this call   Routing to [employer]

## 2017-09-23 MED FILL — MAVYRET/100-40MG/TABS: MAVYRET/100-40MG/TABS | 28 days supply | Qty: 84 | Fill #1

## 2017-09-25 NOTE — Unmapped (Signed)
Please eprescribe teed up medication to pharmacy on encounter.  Please let me know if I can do anything to help you.  Thank you.

## 2017-10-11 ENCOUNTER — Ambulatory Visit: Admit: 2017-10-11 | Discharge: 2017-10-12 | Payer: PRIVATE HEALTH INSURANCE | Attending: Family | Primary: Family

## 2017-10-11 DIAGNOSIS — B182 Chronic viral hepatitis C: Principal | ICD-10-CM

## 2017-10-11 DIAGNOSIS — Z5181 Encounter for therapeutic drug level monitoring: Secondary | ICD-10-CM

## 2017-10-11 LAB — BASIC METABOLIC PANEL
ANION GAP: 8 mmol/L — ABNORMAL LOW (ref 9–15)
BLOOD UREA NITROGEN: 17 mg/dL (ref 7–21)
CALCIUM: 9.3 mg/dL (ref 8.5–10.2)
CHLORIDE: 103 mmol/L (ref 98–107)
CO2: 28 mmol/L (ref 22.0–30.0)
EGFR CKD-EPI AA FEMALE: >90 mL/min/{1.73_m2} (ref >=60)
EGFR CKD-EPI NON-AA FEMALE: >90 mL/min/{1.73_m2} (ref >=60)
GLUCOSE RANDOM: 97 mg/dL (ref 65–179)
POTASSIUM: 3.9 mmol/L (ref 3.5–5.0)
SODIUM: 139 mmol/L (ref 135–145)

## 2017-10-11 LAB — HEPATIC FUNCTION PANEL
ALKALINE PHOSPHATASE: 118 U/L (ref 38–126)
ALT (SGPT): 13 U/L (ref 13–69)
AST (SGOT): 17 U/L (ref 17–47)
BILIRUBIN TOTAL: 0.3 mg/dL (ref 0.0–1.2)
PROTEIN TOTAL: 7.7 g/dL (ref 6.5–8.3)

## 2017-10-11 LAB — CBC W/ AUTO DIFF
BASOPHILS ABSOLUTE COUNT: 0.1 10*9/L (ref 0.0–0.1)
BASOPHILS RELATIVE PERCENT: 0.7 %
EOSINOPHILS ABSOLUTE COUNT: 0.1 10*9/L (ref 0.0–0.4)
EOSINOPHILS RELATIVE PERCENT: 1.1 %
HEMATOCRIT: 42.3 % (ref 36.0–46.0)
HEMOGLOBIN: 13.6 g/dL (ref 12.0–16.0)
LARGE UNSTAINED CELLS: 1 % (ref 0–4)
LYMPHOCYTES ABSOLUTE COUNT: 2.1 10*9/L (ref 1.5–5.0)
MEAN CORPUSCULAR HEMOGLOBIN CONC: 32 g/dL (ref 31.0–37.0)
MEAN CORPUSCULAR HEMOGLOBIN: 31.3 pg (ref 26.0–34.0)
MEAN CORPUSCULAR VOLUME: 97.6 fL (ref 80.0–100.0)
MONOCYTES ABSOLUTE COUNT: 0.3 10*9/L (ref 0.2–0.8)
MONOCYTES RELATIVE PERCENT: 4.3 %
NEUTROPHILS ABSOLUTE COUNT: 4.9 10*9/L (ref 2.0–7.5)
NEUTROPHILS RELATIVE PERCENT: 64.8 %
PLATELET COUNT: 257 10*9/L (ref 150–440)
RED BLOOD CELL COUNT: 4.34 10*12/L (ref 4.00–5.20)
WBC ADJUSTED: 7.6 10*9/L (ref 4.5–11.0)

## 2017-10-11 LAB — WBC ADJUSTED: Lab: 7.6

## 2017-10-11 LAB — ALBUMIN: Albumin:MCnc:Pt:Ser/Plas:Qn:: 4.2

## 2017-10-11 LAB — CALCIUM: Calcium:MCnc:Pt:Ser/Plas:Qn:: 9.3

## 2017-10-11 NOTE — Unmapped (Signed)
1.  Laboratory studies done today as part of chronic hepatitis C care.   2.  Continue taking Mavyret three tablets daily with food. Avoid missing any doses.   3.  Okay to take up to 2,000 mg of Tylenol as needed for pain.   4.  Continue to remain very well hydrated. Drinking lots of water daily.   5.  Healthy diet, regular exercise program and weight management.   6.  Follow up with your new primary care provider as discussed.   7.  Any questions please let me know.   8.  Office follow up four weeks.

## 2017-10-11 NOTE — Unmapped (Signed)
[facility]    Amy Leach, M.D.  Professor of Medicine  Director, [facility]  [place] [facility] [place] [facility] [place]    [phone]    Primary Care Provider: Jeanine Luz, MD    Chief complaint: Treatment follow up HCV, genotype 3  Mavyret x 8 weeks  Start Date: September 04, 2017  TW #6    Present illness: Patient is a 47 y.o. Caucasian female with chronic hepatitis C that she states was diagnosed approximately 30 years ago.  She went to donate blood and was told that she was positive for hepatitis C. Over the years she has never been treated.      She presents alone today. She has adhered to taking Mavyret three tablets daily with food. Denies having missed any doses. Taking three tablets between 4 PM and 6 PM. Today will be day # 38. Denies any alcohol use. Advises, her current partner has been screened for HCV with non reactive results.     She advises she has been experiencing severe potential adverse events. Complaint of severe headaches despite increasing water intake and taking up to 4,000 mg total daily. She confessed to taking someone's methadone due to the severity of the pain. Also stated that I had some methadone left over at time. She smokes kemp. She requested several times for pain medication. Requested refill on flexeril and Ibuprofen.     Complaint of being more irritable. Complaint of lower back pain with pain radiating to both legs. States pain level is 10. Denies any associated GI symptoms other than noted change in bowel habit pattern. + loose stools. Complaint of easy bruising. In making remark states looks like needle marks, but I have not been using.     ROS: All systems reviewed and negative except as noted in HPI.     Liver Section:  1. Chronic hepatitis C:   Baseline HCV: 1.5 million IU/mL  HCV RNA TW #6- pending    2.  FIBROSCAN 07/2017: 5.0 KPS c/w stage F1 mild fibrosis  3.  Immunity hepatitis A and B:   Ref. Range 08/22/2017 13:21   Hepatitis A IgG Latest Ref Range: Nonreactive  Reactive (A)   Hepatitis B Surface Ab Quant Latest Ref Range: <8.00 m(IU)/mL 9.96 (H)   Hep B S Ab Latest Ref Range: Nonreactive, Grayzone  Grayzone   Hep B Core Total Ab Latest Ref Range: Nonreactive  Reactive (A)     4. HIV - non reactive   5. Liver screening: Abd U/S 09/11/2016  Mildly heterogeneous and echogenic liver likely secondary to chronic liver disease and/or hepatic steatosis. No hepatic masses. Cholelithiasis. No secondary signs to suggest acute cholecystitis.    Past medical history:    Active Ambulatory Problems     Diagnosis Date Noted   ??? 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 Chronic hepatitis C (CMS-HCC) 07/28/2010   ??? Chronic airway obstruction (CMS-HCC) 05/26/2011   ??? Depressive disorder 07/28/2010   ??? Esophageal reflux 07/28/2010   ??? Suicide and self-inflicted suffocation by other specified means 05/26/2011   ??? Tobacco use disorder 07/28/2010   ??? Gastritis and gastroduodenitis 07/14/2011   ??? At risk for long QT syndrome 07/19/2017   ??? Non-intractable vomiting with nausea 07/19/2017   ??? Difficult or painful urination 11/20/2012   ??? Follow-up examination after gynecological surgery 11/20/2012   ??? Ovarian mass 09/16/2012     Resolved Ambulatory Problems     Diagnosis Date Noted   ???  No Resolved Ambulatory Problems     Past Medical History:   Diagnosis Date   ??? Abnormal mammogram    ??? Alcohol abuse    ??? COPD (chronic obstructive pulmonary disease) (CMS-HCC)    ??? Hepatitis C    ??? Substance abuse (CMS-HCC)      No Known Allergies    Current Outpatient Medications   Medication Sig Dispense Refill   ??? albuterol (PROAIR HFA) 90 mcg/actuation inhaler Inhale 2 puffs every six (6) hours as needed for wheezing or shortness of breath. 1 Inhaler 5   ??? ALPRAZolam (NIRAVAM) 1 MG dissolvable tablet Take 1 mg by mouth Three (3) times a day as needed for anxiety.     ??? cetirizine (ZYRTEC) 10 MG tablet Take 1 tablet (10 mg total) by mouth daily. TAKE 1 TABLET(10 MG) BY MOUTH DAILY 30 tablet 11   ??? diclofenac sodium (VOLTAREN) 1 % gel Apply 2 g topically Four (4) times a day. 100 g 0   ??? fluticasone-salmeterol (ADVAIR DISKUS) 100-50 mcg/dose diskus Inhale 1 puff Two (2) times a day. 180 each 3   ??? glecaprevir-pibrentasvir (MAVYRET) 100-40 mg tablet TAKE 3 TABLETS BY MOUTH DAILY WITH FOOD 84 tablet 1   ??? promethazine (PHENERGAN) 25 MG tablet Take 1 tablet (25 mg total) by mouth every six (6) hours as needed for nausea. 30 tablet prn   ??? RABEprazole (ACIPHEX) 20 mg tablet Take 1 tablet (20 mg total) by mouth Two (2) times a day. 180 tablet 3   ??? tiotropium (SPIRIVA WITH HANDIHALER) 18 mcg inhalation capsule Place 1 capsule (18 mcg total) into inhaler and inhale once daily. 90 capsule 3   ??? traZODone (DESYREL) 100 MG tablet Take 150 mg by mouth. Frequency:PHARMDIR   Dosage:150   MG  Instructions:  Note:May take one tablet at bedtime as needed for insomnia Dose: 150 MG     ??? cyclobenzaprine (FLEXERIL) 10 MG tablet Take 1 tablet (10 mg total) by mouth nightly as needed for muscle spasms. (Patient not taking: Reported on 10/11/2017) 30 tablet 0   ??? ibuprofen (ADVIL,MOTRIN) 600 MG tablet TAKE 1 TABLET(600 MG) BY MOUTH EVERY 8 HOURS AS NEEDED FOR PAIN (Patient not taking: Reported on 10/11/2017) 60 tablet 0     No current facility-administered medications for this visit.      Social history: The patient is not married.  She has not had does not have any children.  She smokes one and half pack cigarettes per day.  She drinks alcohol one time per month.  She currently is not working on disability for variety of medical conditions.    Family history: Negative for liver disease or liver cancer although her father drank alcohol.    Physical Examination:  BP 114/80  - Pulse 76  - Temp 36.9 ??C (98.4 ??F) (Oral)  - Resp 16  - Ht 170.2 cm (5' 7.01)  - Wt 81.3 kg (179 lb 4.8 oz)  - SpO2 100%  - BMI 28.08 kg/m??   General: WD, WN, Overweight. Very talkative. Appears   Neuro: Grossly intact, No focal deficits  Rest of PE deferred.     Laboratory Studies:   Results for orders placed or performed in visit on 10/11/17   Basic metabolic panel   Result Value Ref Range    Sodium 139 135 - 145 mmol/L    Potassium 3.9 3.5 - 5.0 mmol/L    Chloride 103 98 - 107 mmol/L    CO2 28.0 22.0 - 30.0  mmol/L    Anion Gap 8 (L) 9 - 15 mmol/L    BUN 17 7 - 21 mg/dL    Creatinine 1.61 0.96 - 1.00 mg/dL    BUN/Creatinine Ratio 24     EGFR CKD-EPI Non-African American, Female >90 >=60 mL/min/1.3m2    EGFR CKD-EPI African American, Female >90 >=60 mL/min/1.76m2    Glucose 97 65 - 179 mg/dL    Calcium 9.3 8.5 - 04.5 mg/dL   Hepatic Function Panel   Result Value Ref Range    Albumin 4.2 3.5 - 5.0 g/dL    Total Protein 7.7 6.5 - 8.3 g/dL    Total Bilirubin 0.3 0.0 - 1.2 mg/dL    Bilirubin, Direct 4.09 0.00 - 0.40 mg/dL    AST 17 17 - 47 U/L    ALT 13 13 - 69 U/L    Alkaline Phosphatase 118 38 - 126 U/L   CBC w/ Differential   Result Value Ref Range    WBC 7.6 4.5 - 11.0 10*9/L    RBC 4.34 4.00 - 5.20 10*12/L    HGB 13.6 12.0 - 16.0 g/dL    HCT 81.1 91.4 - 78.2 %    MCV 97.6 80.0 - 100.0 fL    MCH 31.3 26.0 - 34.0 pg    MCHC 32.0 31.0 - 37.0 g/dL    RDW 95.6 21.3 - 08.6 %    MPV 7.4 7.0 - 10.0 fL    Platelet 257 150 - 440 10*9/L    Neutrophils % 64.8 %    Lymphocytes % 28.3 %    Monocytes % 4.3 %    Eosinophils % 1.1 %    Basophils % 0.7 %    Absolute Neutrophils 4.9 2.0 - 7.5 10*9/L    Absolute Lymphocytes 2.1 1.5 - 5.0 10*9/L    Absolute Monocytes 0.3 0.2 - 0.8 10*9/L    Absolute Eosinophils 0.1 0.0 - 0.4 10*9/L    Absolute Basophils 0.1 0.0 - 0.1 10*9/L    Large Unstained Cells 1 0 - 4 %     Impression/Plan:  1.  Chronic hepatitis C, treatment naive, genotype 3 of long-standing duration.  She presents today for treatment follow up ~ TW #6.  She has adhered to Mavyret three tablets daily with food. Denies missing any doses. No alcohol use. She has experienced headaches as well as irritably. Advised patient back pain and radiculopathy is not r/t to DAA treatment. Fibroscan consistent with F0-F1.     ~ HCV safety labs ordered.   ~ Office follow up four weeks.   ~ Continue taking Mavyret three tablets daily with food. Avoid missing any doses.   ~ Increase water intake.   ~ Okay to take Tylenol up to 2,000 mg total daily.     2.  + immunity hepatitis A and B: Could benefit booster Hepatitis B vaccine.     3. Alcohol assessment: No alcohol use.     4. Chronic lower back pain: Recommend remaining scheduled for upcoming appointment with PCP. Declined RX for pain management. Patient taking another person's methadone.     All patient's questions were answered to her satisfaction during office visit.     Rodman Key, DNP, FNP-BC  [facility]  [address]  [place] [address]  Phone [phone]

## 2017-10-15 LAB — HCV RNA(IU): Hepatitis C virus RNA:ACnc:Pt:Ser/Plas:Qn:Probe.amp.tar: <12 — ABNORMAL HIGH

## 2017-10-15 LAB — HEPATITIS C RNA, QUANTITATIVE, PCR
HCV RNA(IU): <12 [IU]/mL — ABNORMAL HIGH (ref <=0)
HCV RNA: DETECTED

## 2017-10-15 NOTE — Unmapped (Signed)
Received email from Amy Leach stating patient wants to know her test results. Called patient and reviewed ultrasound and lab results from 10/11/17, except HCV level is in progress. She verbalizes understanding. I let her know I will call her back with the HCV results.

## 2017-11-08 ENCOUNTER — Encounter: Admit: 2017-11-08 | Discharge: 2017-11-08 | Payer: PRIVATE HEALTH INSURANCE | Attending: Family | Primary: Family

## 2017-11-08 DIAGNOSIS — B182 Chronic viral hepatitis C: Principal | ICD-10-CM

## 2017-11-08 LAB — CBC W/ AUTO DIFF
BASOPHILS ABSOLUTE COUNT: 0 10*9/L (ref 0.0–0.1)
BASOPHILS RELATIVE PERCENT: 0.6 %
EOSINOPHILS ABSOLUTE COUNT: 0.1 10*9/L (ref 0.0–0.4)
EOSINOPHILS RELATIVE PERCENT: 1.4 %
HEMATOCRIT: 41.7 % (ref 36.0–46.0)
HEMOGLOBIN: 13.2 g/dL (ref 12.0–16.0)
LARGE UNSTAINED CELLS: 2 % (ref 0–4)
LYMPHOCYTES RELATIVE PERCENT: 38.1 %
MEAN CORPUSCULAR HEMOGLOBIN CONC: 31.7 g/dL (ref 31.0–37.0)
MEAN CORPUSCULAR HEMOGLOBIN: 30.6 pg (ref 26.0–34.0)
MEAN CORPUSCULAR VOLUME: 96.6 fL (ref 80.0–100.0)
MEAN PLATELET VOLUME: 7.8 fL (ref 7.0–10.0)
MONOCYTES ABSOLUTE COUNT: 0.2 10*9/L (ref 0.2–0.8)
MONOCYTES RELATIVE PERCENT: 4.3 %
NEUTROPHILS ABSOLUTE COUNT: 3 10*9/L (ref 2.0–7.5)
NEUTROPHILS RELATIVE PERCENT: 53.7 %
RED BLOOD CELL COUNT: 4.31 10*12/L (ref 4.00–5.20)
RED CELL DISTRIBUTION WIDTH: 13.2 % (ref 12.0–15.0)
WBC ADJUSTED: 5.6 10*9/L (ref 4.5–11.0)

## 2017-11-08 LAB — BASIC METABOLIC PANEL
ANION GAP: 5 mmol/L — ABNORMAL LOW (ref 9–15)
BLOOD UREA NITROGEN: 17 mg/dL (ref 7–21)
BUN / CREAT RATIO: 25
CALCIUM: 9 mg/dL (ref 8.5–10.2)
CO2: 30 mmol/L (ref 22.0–30.0)
CREATININE: 0.68 mg/dL (ref 0.60–1.00)
EGFR CKD-EPI AA FEMALE: >90 mL/min/{1.73_m2} (ref >=60)
EGFR CKD-EPI NON-AA FEMALE: >90 mL/min/{1.73_m2} (ref >=60)
GLUCOSE RANDOM: 97 mg/dL (ref 65–179)
POTASSIUM: 4.1 mmol/L (ref 3.5–5.0)
SODIUM: 140 mmol/L (ref 135–145)

## 2017-11-08 LAB — BILIRUBIN TOTAL: Bilirubin:MCnc:Pt:Ser/Plas:Qn:: 0.4

## 2017-11-08 LAB — LARGE UNSTAINED CELLS: Lab: 2

## 2017-11-08 LAB — HEPATIC FUNCTION PANEL
ALT (SGPT): 17 U/L (ref 13–69)
AST (SGOT): 21 U/L (ref 17–47)
BILIRUBIN DIRECT: 0.1 mg/dL (ref 0.00–0.40)
PROTEIN TOTAL: 7.6 g/dL (ref 6.5–8.3)

## 2017-11-08 LAB — CO2: Carbon dioxide:SCnc:Pt:Ser/Plas:Qn:: 30

## 2017-11-08 NOTE — Unmapped (Signed)
Amy Leach    Alba Destine, M.D.  Professor of Medicine  Director, [facility]  [place] [facility] [place] [facility] [place]    [phone]    Primary Care Provider: Jeanine Luz, MD    Chief complaint: Treatment follow up HCV, genotype 3  Mavyret x 8 weeks  Start Date: September 04, 2017 - completion date: October 29, 2017  EOT    Present illness: Patient is a 47 y.o. Caucasian female with chronic hepatitis C that she states was diagnosed approximately 30 years ago. She went to donate blood and was told that she was positive for hepatitis C. Over the years she has never been treated.      She presents alone today. She has adhered to taking Mavyret three tablets daily with food. Denies having missed any doses. Took three tablets between 4 PM and 6 PM. She presents today for EOT. She did not tolerate treatment very well. She advised the medication made her ill. She experienced headaches. She took someone else's methadone for pain management. She is feeling much better now that treatment is done. She advises, she feels like she is back to her old self. Denies any alcohol use during treatment. Prior to treatment she drank on average one drink every three months. Advises, her current partner has been screened for HCV with non reactive results. Denies any sexual activity over the past two years. She continues to smoke kemp. Last visit, she requested several times for pain medication and refills for flexeril and Ibuprofen. Today these requests were not mentioned.     ROS: All systems reviewed and negative except as noted in HPI.     Liver Section:  1. Chronic hepatitis C:   Baseline HCV: 1.5 million IU/mL  HCV RNA TW #6- detected less than 12 IU/mL  HCV RNA EOT - pending     2.  FIBROSCAN 07/2017: 5.0 KPS c/w stage F1 mild fibrosis  3.  Immunity hepatitis A and B:   Ref. Range 08/22/2017 13:21   Hepatitis A IgG Latest Ref Range: Nonreactive  Reactive (A)   Hepatitis B Surface Ab Quant Latest Ref Range: <8.00 m(IU)/mL 9.96 (H)   Hep B S Ab Latest Ref Range: Nonreactive, Grayzone  Grayzone   Hep B Core Total Ab Latest Ref Range: Nonreactive  Reactive (A)     4. HIV - non reactive   5. Liver screening: Abd U/S 09/11/2016  Mildly heterogeneous and echogenic liver likely secondary to chronic liver disease and/or hepatic steatosis. No hepatic masses. Cholelithiasis. No secondary signs to suggest acute cholecystitis.    Past medical history:    Active Ambulatory Problems     Diagnosis Date Noted   ??? 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 Chronic hepatitis C (CMS-HCC) 07/28/2010   ??? Chronic airway obstruction (CMS-HCC) 05/26/2011   ??? Depressive disorder 07/28/2010   ??? Esophageal reflux 07/28/2010   ??? Suicide and self-inflicted suffocation by other specified means 05/26/2011   ??? Tobacco use disorder 07/28/2010   ??? Gastritis and gastroduodenitis 07/14/2011   ??? At risk for long QT syndrome 07/19/2017   ??? Non-intractable vomiting with nausea 07/19/2017   ??? Difficult or painful urination 11/20/2012   ??? Follow-up examination after gynecological surgery 11/20/2012   ??? Ovarian mass 09/16/2012     Resolved Ambulatory Problems     Diagnosis Date Noted   ??? No Resolved Ambulatory Problems     Past Medical History:   Diagnosis Date   ???  Abnormal mammogram    ??? Alcohol abuse    ??? COPD (chronic obstructive pulmonary disease) (CMS-HCC)    ??? Hepatitis C    ??? Substance abuse (CMS-HCC)      No Known Allergies    Current Outpatient Medications   Medication Sig Dispense Refill   ??? albuterol (PROAIR HFA) 90 mcg/actuation inhaler Inhale 2 puffs every six (6) hours as needed for wheezing or shortness of breath. 1 Inhaler 5   ??? ALPRAZolam (NIRAVAM) 1 MG dissolvable tablet Take 2 mg by mouth Three (3) times a day as needed for anxiety.      ??? cetirizine (ZYRTEC) 10 MG tablet Take 1 tablet (10 mg total) by mouth daily. TAKE 1 TABLET(10 MG) BY MOUTH DAILY 30 tablet 11   ??? cyclobenzaprine (FLEXERIL) 10 MG tablet Take 1 tablet (10 mg total) by mouth nightly as needed for muscle spasms. 30 tablet 0   ??? diclofenac sodium (VOLTAREN) 1 % gel Apply 2 g topically Four (4) times a day. 100 g 0   ??? ibuprofen (ADVIL,MOTRIN) 600 MG tablet TAKE 1 TABLET(600 MG) BY MOUTH EVERY 8 HOURS AS NEEDED FOR PAIN 60 tablet 0   ??? promethazine (PHENERGAN) 25 MG tablet Take 1 tablet (25 mg total) by mouth every six (6) hours as needed for nausea. 30 tablet prn   ??? RABEprazole (ACIPHEX) 20 mg tablet Take 1 tablet (20 mg total) by mouth Two (2) times a day. 180 tablet 3   ??? traZODone (DESYREL) 100 MG tablet Take 150 mg by mouth. Frequency:PHARMDIR   Dosage:150   MG  Instructions:  Note:May take one tablet at bedtime as needed for insomnia Dose: 150 MG     ??? fluticasone-salmeterol (ADVAIR DISKUS) 100-50 mcg/dose diskus Inhale 1 puff Two (2) times a day. 180 each 3   ??? glecaprevir-pibrentasvir (MAVYRET) 100-40 mg tablet TAKE 3 TABLETS BY MOUTH DAILY WITH FOOD (Patient not taking: Reported on 11/08/2017) 84 tablet 1   ??? tiotropium (SPIRIVA WITH HANDIHALER) 18 mcg inhalation capsule Place 1 capsule (18 mcg total) into inhaler and inhale once daily. (Patient not taking: Reported on 11/08/2017) 90 capsule 3     No current facility-administered medications for this visit.      Social history: The patient is not married. + partner (long-term). She does not have any children. She smokes one and half pack cigarettes per day.  She drinks alcohol one time per month. + Hemp use (CBD). She currently is not working on disability for variety of medical conditions.    Family history: Negative for liver disease or liver cancer although her father drank alcohol.    Physical Examination:  BP 115/80  - Pulse 86  - Temp 36.3 ??C (97.3 ??F) (Oral)  - Resp 14  - Ht 170.2 cm (5' 7.01)  - Wt 82.8 kg (182 lb 8 oz)  - SpO2 96%  - BMI 28.58 kg/m??   General: WD, WN, Overweight. Very talkative. Appears   Neuro: Grossly intact, No focal deficits  Rest of PE deferred.     Laboratory Studies:   Results for orders placed or performed in visit on 11/08/17   Basic metabolic panel   Result Value Ref Range    Sodium 140 135 - 145 mmol/L    Potassium 4.1 3.5 - 5.0 mmol/L    Chloride 105 98 - 107 mmol/L    CO2 30.0 22.0 - 30.0 mmol/L    Anion Gap 5 (L) 9 - 15 mmol/L    BUN  17 7 - 21 mg/dL    Creatinine 1.61 0.96 - 1.00 mg/dL    BUN/Creatinine Ratio 25     EGFR CKD-EPI Non-African American, Female >90 >=60 mL/min/1.41m2    EGFR CKD-EPI African American, Female >90 >=60 mL/min/1.34m2    Glucose 97 65 - 179 mg/dL    Calcium 9.0 8.5 - 04.5 mg/dL   Hepatic Function Panel   Result Value Ref Range    Albumin 4.0 3.5 - 5.0 g/dL    Total Protein 7.6 6.5 - 8.3 g/dL    Total Bilirubin 0.4 0.0 - 1.2 mg/dL    Bilirubin, Direct 4.09 0.00 - 0.40 mg/dL    AST 21 17 - 47 U/L    ALT 17 13 - 69 U/L    Alkaline Phosphatase 107 38 - 126 U/L   CBC w/ Differential   Result Value Ref Range    WBC 5.6 4.5 - 11.0 10*9/L    RBC 4.31 4.00 - 5.20 10*12/L    HGB 13.2 12.0 - 16.0 g/dL    HCT 81.1 91.4 - 78.2 %    MCV 96.6 80.0 - 100.0 fL    MCH 30.6 26.0 - 34.0 pg    MCHC 31.7 31.0 - 37.0 g/dL    RDW 95.6 21.3 - 08.6 %    MPV 7.8 7.0 - 10.0 fL    Platelet 254 150 - 440 10*9/L    Neutrophils % 53.7 %    Lymphocytes % 38.1 %    Monocytes % 4.3 %    Eosinophils % 1.4 %    Basophils % 0.6 %    Neutrophil Left Shift 1+ (A) Not Present    Absolute Neutrophils 3.0 2.0 - 7.5 10*9/L    Absolute Lymphocytes 2.1 1.5 - 5.0 10*9/L    Absolute Monocytes 0.2 0.2 - 0.8 10*9/L    Absolute Eosinophils 0.1 0.0 - 0.4 10*9/L    Absolute Basophils 0.0 0.0 - 0.1 10*9/L    Large Unstained Cells 2 0 - 4 %    Macrocytosis Slight (A) Not Present     Impression/Plan:  1.  Chronic hepatitis C, treatment naive, genotype 3 of long-standing duration.  She presents today for treatment follow up ~ EOT.  She  adhered 100 percent to taking Mavyret three tablets daily with food. Denies missing any doses. No alcohol use. She has experienced headaches, back pain, radiculopathy, as well as irritably. All these symptoms are resolved with completion of treatment. Fibroscan consistent with F0-F1.     ~ HCV safety labs ordered.   ~ Office follow up three months for SVR.   ~ Healthy diet, regular exercise program and weight management.     2.  + immunity hepatitis A and B: Hepatitis B vaccine administered ~ booster.      3. Alcohol assessment: No alcohol use.     All patient's questions were answered to her satisfaction during office visit.     Rodman Key, DNP, FNP-BC  [facility]  [address]  [place] [address]  Phone [phone]

## 2017-11-08 NOTE — Unmapped (Signed)
1.  Laboratory studies done today as part of chronic hepatitis C care.   2.  Healthy diet, regular exercise program and weight management.   3.  Follow up three months  4.  Any questions please let me know.   5.  Hepatitis B vaccine given to assist in boosting your immunity status.

## 2017-11-12 LAB — HEPATITIS C RNA, QUANTITATIVE, PCR: HCV RNA(IU): <12 [IU]/mL — ABNORMAL HIGH (ref <=0)

## 2017-11-12 LAB — HCV RNA COMMENT: Lab: 0

## 2017-11-15 NOTE — Unmapped (Addendum)
Voicemail box is full. Unable to leave a message.    ----- Message from [facility], [place] sent at 11/13/2017  8:16 AM EDT -----  Regarding: HCV RNA   Raelea Gosse,    Please let Ms. Bembenek know that her HCV RNA level at EOT is detected less than 12 IU/mL which means essentially not detected at this time. She needs to follow up as scheduled for SVR allowing [place] to repeat labs and hopefully be able to advise her of being cured.     Dawn

## 2017-11-15 NOTE — Unmapped (Addendum)
Patient returned my call. I let her know the Hep C virus is not detected. Reminded her to keep her appointment in December (02/07/18 @ 2:30p) we will draw blood again, and if it is still not detected, then she will be cured of HCV. Patient replied you just made my day and states will keep her appointment    ----- Message from [facility], [place] sent at 11/13/2017  8:16 AM EDT -----  Regarding: HCV RNA   Jaquayla Hege,    Please let Ms. Tesoro know that her HCV RNA level at EOT is detected less than 12 IU/mL which means essentially not detected at this time. She needs to follow up as scheduled for SVR allowing [place] to repeat labs and hopefully be able to advise her of being cured.     Dawn

## 2017-12-03 MED ORDER — ALBUTEROL SULFATE HFA 90 MCG/ACTUATION AEROSOL INHALER
Freq: Four times a day (QID) | RESPIRATORY_TRACT | 5 refills | 0.00000 days | Status: CP | PRN
Start: 2017-12-03 — End: 2017-12-03

## 2017-12-03 MED ORDER — ALBUTEROL SULFATE HFA 90 MCG/ACTUATION AEROSOL INHALER: 2 | Inhaler | Freq: Four times a day (QID) | 5 refills | 0 days | Status: AC

## 2017-12-16 ENCOUNTER — Encounter: Admit: 2017-12-16 | Discharge: 2017-12-17 | Discharge status: Against Medical Advice | Payer: PRIVATE HEALTH INSURANCE

## 2017-12-16 LAB — URINALYSIS WITH CULTURE REFLEX
BILIRUBIN UA: NEGATIVE
BLOOD UA: NEGATIVE
KETONES UA: NEGATIVE
LEUKOCYTE ESTERASE UA: NEGATIVE
PH UA: 5.5 (ref 5.0–9.0)
PROTEIN UA: NEGATIVE
SPECIFIC GRAVITY UA: 1.02 (ref 1.005–1.040)
SQUAMOUS EPITHELIAL: 9 /HPF — ABNORMAL HIGH (ref 0–5)
UROBILINOGEN UA: 0.2
WBC UA: 2 /HPF (ref 0–5)

## 2017-12-16 LAB — BASIC METABOLIC PANEL
ANION GAP: 6 mmol/L — ABNORMAL LOW (ref 7–15)
BLOOD UREA NITROGEN: 16 mg/dL (ref 7–21)
BUN / CREAT RATIO: 24
CHLORIDE: 110 mmol/L — ABNORMAL HIGH (ref 98–107)
CO2: 29 mmol/L (ref 22.0–30.0)
EGFR CKD-EPI AA FEMALE: >90 mL/min/{1.73_m2} (ref >=60)
GLUCOSE RANDOM: 89 mg/dL (ref 65–179)
POTASSIUM: 4.3 mmol/L (ref 3.5–5.0)
SODIUM: 145 mmol/L (ref 135–145)

## 2017-12-16 LAB — CBC W/ AUTO DIFF
BASOPHILS ABSOLUTE COUNT: 0 10*9/L (ref 0.0–0.1)
BASOPHILS RELATIVE PERCENT: 0.8 %
EOSINOPHILS ABSOLUTE COUNT: 0.1 10*9/L (ref 0.0–0.4)
HEMATOCRIT: 39.6 % (ref 36.0–46.0)
HEMOGLOBIN: 12.9 g/dL — ABNORMAL LOW (ref 13.5–16.0)
LARGE UNSTAINED CELLS: 2 % (ref 0–4)
LYMPHOCYTES ABSOLUTE COUNT: 2 10*9/L (ref 1.5–5.0)
LYMPHOCYTES RELATIVE PERCENT: 43.6 %
MEAN CORPUSCULAR HEMOGLOBIN CONC: 32.6 g/dL (ref 31.0–37.0)
MEAN CORPUSCULAR VOLUME: 99.8 fL (ref 80.0–100.0)
MEAN PLATELET VOLUME: 7.5 fL (ref 7.0–10.0)
MONOCYTES ABSOLUTE COUNT: 0.2 10*9/L (ref 0.2–0.8)
MONOCYTES RELATIVE PERCENT: 4.9 %
NEUTROPHILS ABSOLUTE COUNT: 2.2 10*9/L (ref 2.0–7.5)
PLATELET COUNT: 226 10*9/L (ref 150–440)
RED BLOOD CELL COUNT: 3.97 10*12/L — ABNORMAL LOW (ref 4.00–5.20)
RED CELL DISTRIBUTION WIDTH: 14.5 % (ref 12.0–15.0)
WBC ADJUSTED: 4.6 10*9/L (ref 4.5–11.0)

## 2017-12-16 LAB — MAGNESIUM
MAGNESIUM: 1.6 mg/dL (ref 1.6–2.2)
Magnesium:MCnc:Pt:Ser/Plas:Qn:: 1.6

## 2017-12-16 LAB — MACROCYTES

## 2017-12-16 LAB — BACTERIA

## 2017-12-16 LAB — ANION GAP: Anion gap 3:SCnc:Pt:Ser/Plas:Qn:: 6 — ABNORMAL LOW

## 2017-12-17 NOTE — Unmapped (Signed)
Pt ambulatory to triage c/o migraine x3 days with nausea. Pt reports pain in bilat temples, eyes, and neck. Pt reports h/o recurrent migraines. Pt reports taking alprazolam, tylenol and motrin without relief.

## 2017-12-17 NOTE — Unmapped (Signed)
Escorted patient to room 5. Patient walked with steady gate. Showed patient the gown and blanket.  Patient wanted to know how long she would be here and asked if the nurse could bring her something on for pain.  I let patient know depending on tests to be done and how long it took results to get back would depend a lot on her stay and I could not give a time frame,  And the doctor would not order pain meds with out seeing her first.  Patient stated she understood.

## 2017-12-17 NOTE — Unmapped (Signed)
Pt walked out of room with gown wrapped around her arm stating some one needs to stop this bleeding so I can leave, patient asked why she removed her PIV she states she is better and ready to leave, pt had blood all over room and IVF spilling into floor, RN applied bandage to site where IV removed and cleaned room. Pt left and refused to sign papers or have vitals done. Steady gait when leaving dept

## 2017-12-17 NOTE — Unmapped (Signed)
[facility]  Emergency Department Provider Note    ED Clinical Impression     Final diagnoses:   Chronic nonintractable headache, unspecified headache type (Primary)     Initial Impression, ED Course, Assessment and Plan     Impression: Patient is a 47 y.o. female with PMH of chronic hepatitis C, COPD, and substance abuse presenting to the ED for headache localized behind her eyes and around to the back of her head onset 3 days ago with associated nausea, two episodes of vomiting, photophobia, and phonophobia.     On exam, the patient appears well and is in NAD. Vital signs are within normal limits. Heart rate and rhythm regular. Lungs clear to auscultation bilaterally. Unremarkable neurologic exam. No nuchal rigidity.     Given patient's been having headache for about 3 months and only has slightly worse intensity of the same headache the last few days, low concern for intracranial abnormality such as CVA, also less likely without neurologic deficits..  Given that she had preceding diarrhea for a few days before the headache worsen, most likely dehydrated leading to provocation of her chronic headache.  Given the low concern for intracranial abnormality, will plan for IV fluids and headache cocktail and reassess, if still having a significant headache, will plan for CT.  Will obtain flu swab to rule out influenza due to myalgias.    2032  Patient left AMA without waiting to discuss with MD.    Disposition: AMA    Additional Medical Decision Making     I have reviewed the vital signs and the nursing notes.   Labs and radiology results that were available during my care of the patient were independently reviewed by me and considered in my medical decision making.   I discussed the case with Dr. Marina Goodell.   I reviewed the patient's prior medical records.     Portions of this record have been created using Scientist, clinical (histocompatibility and immunogenetics). Dictation errors have been sought, but may not have been identified and corrected.  ____________________________________________     History     Chief Complaint  Headache Recurrent or Known Dx Migraines      HPI   Amy Leach is a 47 y.o. female with PMH of chronic hepatitis C, COPD, and substance abuse presenting to the ED for headache. Patient reports headache localized behind her eyes and around to the back of her head onset 3 days ago with associated nausea, two episodes of vomiting, photophobia, and phonophobia. She has tried taking Tylenol and ibuprofen with no relief. She notes she has had intermittent headaches of similar nature, but less severe, since July 9th when she began taking a medication for hepatitis C. Patient also has a history of migraines several years ago and her current headache feels similar to these. Patient denies any other medical concerns at this time.        Past Medical History:   Diagnosis Date   ??? Abnormal mammogram    ??? Alcohol abuse    ??? Chronic bronchitis (CMS-HCC)    ??? COPD (chronic obstructive pulmonary disease) (CMS-HCC)    ??? Hepatitis C    ??? Substance abuse (CMS-HCC)        Patient Active Problem List   Diagnosis   ??? Nonpsychotic mental disorder   ??? Chronic hepatitis C (CMS-HCC)   ??? Chronic airway obstruction (CMS-HCC)   ??? Depressive disorder   ??? Esophageal reflux   ??? Suicide and self-inflicted suffocation by other  specified means   ??? Tobacco use disorder   ??? Gastritis and gastroduodenitis   ??? At risk for long QT syndrome   ??? Non-intractable vomiting with nausea   ??? Difficult or painful urination   ??? Follow-up examination after gynecological surgery   ??? Ovarian mass       Past Surgical History:   Procedure Laterality Date   ??? HYSTERECTOMY     ??? OOPHORECTOMY     ??? PR COLSC FLX W/RMVL OF TUMOR POLYP LESION SNARE TQ N/A 08/19/2014    Procedure: COLONOSCOPY FLEX; W/REMOV TUMOR/LES BY SNARE;  Surgeon: Erskin Burnet, MD;  [facility];  Service: Gastroenterology       No current facility-administered medications for this encounter.     Current Outpatient Medications:   ???  albuterol HFA 90 mcg/actuation inhaler, Inhale 2 puffs every six (6) hours as needed for wheezing or shortness of breath., Disp: 1 Inhaler, Rfl: 5  ???  ALPRAZolam (NIRAVAM) 1 MG dissolvable tablet, Take 2 mg by mouth Three (3) times a day as needed for anxiety. , Disp: , Rfl:   ???  cetirizine (ZYRTEC) 10 MG tablet, Take 1 tablet (10 mg total) by mouth daily. TAKE 1 TABLET(10 MG) BY MOUTH DAILY, Disp: 30 tablet, Rfl: 11  ???  cyclobenzaprine (FLEXERIL) 10 MG tablet, Take 1 tablet (10 mg total) by mouth nightly as needed for muscle spasms., Disp: 30 tablet, Rfl: 0  ???  diclofenac sodium (VOLTAREN) 1 % gel, Apply 2 g topically Four (4) times a day., Disp: 100 g, Rfl: 0  ???  fluticasone-salmeterol (ADVAIR DISKUS) 100-50 mcg/dose diskus, Inhale 1 puff Two (2) times a day., Disp: 180 each, Rfl: 3  ???  glecaprevir-pibrentasvir (MAVYRET) 100-40 mg tablet, TAKE 3 TABLETS BY MOUTH DAILY WITH FOOD (Patient not taking: Reported on 11/08/2017), Disp: 84 tablet, Rfl: 1  ???  ibuprofen (ADVIL,MOTRIN) 600 MG tablet, TAKE 1 TABLET(600 MG) BY MOUTH EVERY 8 HOURS AS NEEDED FOR PAIN, Disp: 60 tablet, Rfl: 0  ???  promethazine (PHENERGAN) 25 MG tablet, Take 1 tablet (25 mg total) by mouth every six (6) hours as needed for nausea., Disp: 30 tablet, Rfl: prn  ???  RABEprazole (ACIPHEX) 20 mg tablet, Take 1 tablet (20 mg total) by mouth Two (2) times a day., Disp: 180 tablet, Rfl: 3  ???  tiotropium (SPIRIVA WITH HANDIHALER) 18 mcg inhalation capsule, Place 1 capsule (18 mcg total) into inhaler and inhale once daily. (Patient not taking: Reported on 11/08/2017), Disp: 90 capsule, Rfl: 3  ???  traZODone (DESYREL) 100 MG tablet, Take 150 mg by mouth. Frequency:PHARMDIR   Dosage:150   MG  Instructions:  Note:May take one tablet at bedtime as needed for insomnia Dose: 150 MG, Disp: , Rfl:     Allergies  Patient has no known allergies.    Family History   Problem Relation Age of Onset   ??? Breast cancer Neg Hx    ??? Ovarian cancer Neg Hx    ??? Colon cancer Neg Hx    ??? Endometrial cancer Neg Hx        Social History  Social History     Tobacco Use   ??? Smoking status: Current Some Day Smoker     Packs/day: 0.50     Years: 36.00     Pack years: 18.00   ??? Smokeless tobacco: Never Used   Substance Use Topics   ??? Alcohol use: Yes     Alcohol/week: 3.0 standard  drinks     Types: 3 Standard drinks or equivalent per week     Frequency: Monthly or less     Drinks per session: 1 or 2     Binge frequency: Never     Comment: one drink every three months   ??? Drug use: Yes     Types: Cocaine, Marijuana     Comment: patient stated she has been off of cocaine for a few years not, + CBD use        Review of Systems  All other systems have been reviewed and are negative except as otherwise documented.    Constitutional: Negative for fever.  Eyes: Positive for photophobia. Negative for visual changes.  ENT: Positive for phonophobia. Negative for sore throat.  Cardiovascular: Negative for chest pain.  Respiratory: Negative for shortness of breath.  Gastrointestinal: Positive for nausea and vomiting. Negative for abdominal pain or diarrhea.  Genitourinary: Negative for dysuria.  Musculoskeletal: Negative for back pain.  Skin: Negative for rash.  Neurological: Positive for headaches. Negative for focal weakness or numbness.    Physical Exam     ED Triage Vitals [12/16/17 1809]   Enc Vitals Group      BP 126/82      Pulse       SpO2 Pulse 80      Resp 16      Temp 36.8 ??C (98.2 ??F)      Temp Source Oral      SpO2 96 %     Constitutional: Alert and oriented. Well appearing and in no distress.  Eyes: Conjunctivae are normal.  ENT       Head: Normocephalic and atraumatic.       Nose: No congestion.       Mouth/Throat: Mucous membranes are moist.       Neck: No stridor.  Cardiovascular: Normal rate, regular rhythm. Normal and symmetric distal pulses are present in all extremities.  Respiratory: Normal respiratory effort. Breath sounds are normal. Gastrointestinal: Soft and nontender. There is no CVA tenderness.  Musculoskeletal: Normal range of motion in all extremities. No lower extremity tenderness or edema.  Neurologic: Normal speech and language. No gross focal neurologic deficits are appreciated.  Skin: Skin is warm, dry and intact. No rash noted.  Psychiatric: Mood and affect are normal. Speech and behavior are normal.    ______________________________________________________   Documentation assistance was provided by Rolan Lipa, Scribe, on December 16, 2017 at 7:44 PM for Geoffery Lyons, MD.    Documentation assistance was provided by the scribe in my presence.  The documentation recorded by the scribe has been reviewed by me and accurately reflects the services I personally performed.        Tonna Boehringer, MD  Resident  12/17/17 [phone]

## 2018-02-14 MED ORDER — RABEPRAZOLE 20 MG TABLET,DELAYED RELEASE
ORAL_TABLET | Freq: Two times a day (BID) | ORAL | 3 refills | 0 days | Status: CP
Start: 2018-02-14 — End: ?

## 2018-09-30 MED ORDER — CETIRIZINE 10 MG TABLET
ORAL_TABLET | Freq: Every day | ORAL | 11 refills | 30 days | Status: CP
Start: 2018-09-30 — End: ?

## 2018-09-30 MED ORDER — FLUTICASONE 100 MCG-SALMETEROL 50 MCG/DOSE BLISTR POWDR FOR INHALATION
Freq: Two times a day (BID) | RESPIRATORY_TRACT | 3 refills | 0.00000 days | Status: CP
Start: 2018-09-30 — End: 2019-09-30

## 2019-01-20 ENCOUNTER — Institutional Professional Consult (permissible substitution)
Admit: 2019-01-20 | Discharge: 2019-01-21 | Payer: PRIVATE HEALTH INSURANCE | Attending: Student in an Organized Health Care Education/Training Program | Primary: Student in an Organized Health Care Education/Training Program

## 2019-01-20 MED ORDER — DICLOFENAC 1 % TOPICAL GEL
Freq: Four times a day (QID) | TOPICAL | 0 refills | 13 days | Status: CP
Start: 2019-01-20 — End: 2020-01-20

## 2019-01-20 MED ORDER — PROMETHAZINE 25 MG TABLET
ORAL_TABLET | Freq: Four times a day (QID) | ORAL | prn refills | 8 days | Status: CP | PRN
Start: 2019-01-20 — End: ?

## 2019-03-28 ENCOUNTER — Encounter
Admit: 2019-03-28 | Discharge: 2019-03-29 | Payer: PRIVATE HEALTH INSURANCE | Attending: Student in an Organized Health Care Education/Training Program | Primary: Student in an Organized Health Care Education/Training Program

## 2019-05-26 DIAGNOSIS — K219 Gastro-esophageal reflux disease without esophagitis: Principal | ICD-10-CM

## 2019-05-26 DIAGNOSIS — J449 Chronic obstructive pulmonary disease, unspecified: Principal | ICD-10-CM

## 2019-05-26 MED ORDER — FLUTICASONE 100 MCG-SALMETEROL 50 MCG/DOSE BLISTR POWDR FOR INHALATION
Freq: Two times a day (BID) | RESPIRATORY_TRACT | 3 refills | 0 days | Status: CP
Start: 2019-05-26 — End: 2020-05-25

## 2019-05-26 MED ORDER — RABEPRAZOLE 20 MG TABLET,DELAYED RELEASE
ORAL_TABLET | Freq: Two times a day (BID) | ORAL | 3 refills | 90.00000 days | Status: CP
Start: 2019-05-26 — End: ?

## 2019-05-26 MED ORDER — ALBUTEROL SULFATE HFA 90 MCG/ACTUATION AEROSOL INHALER
Freq: Four times a day (QID) | RESPIRATORY_TRACT | 2 refills | 0 days | Status: CP | PRN
Start: 2019-05-26 — End: ?

## 2019-09-19 ENCOUNTER — Institutional Professional Consult (permissible substitution)
Admit: 2019-09-19 | Discharge: 2019-09-20 | Payer: MEDICAID | Attending: Student in an Organized Health Care Education/Training Program | Primary: Student in an Organized Health Care Education/Training Program

## 2019-09-19 MED ORDER — RABEPRAZOLE 20 MG TABLET,DELAYED RELEASE
ORAL_TABLET | Freq: Two times a day (BID) | ORAL | 3 refills | 90.00000 days | Status: CP
Start: 2019-09-19 — End: ?

## 2020-01-09 DIAGNOSIS — J302 Other seasonal allergic rhinitis: Principal | ICD-10-CM

## 2020-01-09 MED ORDER — CETIRIZINE 10 MG TABLET
ORAL_TABLET | Freq: Every day | ORAL | 11 refills | 30 days | Status: CP
Start: 2020-01-09 — End: ?

## 2020-02-27 DIAGNOSIS — J449 Chronic obstructive pulmonary disease, unspecified: Principal | ICD-10-CM

## 2020-02-27 DIAGNOSIS — M79601 Pain in right arm: Principal | ICD-10-CM

## 2020-02-27 DIAGNOSIS — Z79899 Other long term (current) drug therapy: Principal | ICD-10-CM

## 2020-02-27 DIAGNOSIS — F17210 Nicotine dependence, cigarettes, uncomplicated: Principal | ICD-10-CM

## 2020-02-27 DIAGNOSIS — M25521 Pain in right elbow: Principal | ICD-10-CM

## 2020-02-27 DIAGNOSIS — Z90710 Acquired absence of both cervix and uterus: Principal | ICD-10-CM

## 2020-02-27 DIAGNOSIS — Z8619 Personal history of other infectious and parasitic diseases: Principal | ICD-10-CM

## 2020-02-27 DIAGNOSIS — M25531 Pain in right wrist: Principal | ICD-10-CM

## 2020-02-27 DIAGNOSIS — M19011 Primary osteoarthritis, right shoulder: Principal | ICD-10-CM

## 2020-02-27 DIAGNOSIS — M79641 Pain in right hand: Principal | ICD-10-CM

## 2020-02-27 DIAGNOSIS — W19XXXA Unspecified fall, initial encounter: Principal | ICD-10-CM

## 2020-02-28 ENCOUNTER — Ambulatory Visit
Admit: 2020-02-28 | Discharge: 2020-02-28 | Disposition: A | Discharge status: Home / Self Care | Payer: PRIVATE HEALTH INSURANCE | Attending: Emergency Medicine

## 2020-02-28 ENCOUNTER — Emergency Department
Admit: 2020-02-28 | Discharge: 2020-02-28 | Disposition: A | Discharge status: Home / Self Care | Payer: PRIVATE HEALTH INSURANCE | Attending: Emergency Medicine

## 2020-02-28 DIAGNOSIS — S5291XA Unspecified fracture of right forearm, initial encounter for closed fracture: Principal | ICD-10-CM

## 2020-02-28 DIAGNOSIS — F17200 Nicotine dependence, unspecified, uncomplicated: Principal | ICD-10-CM

## 2020-02-28 DIAGNOSIS — Z7951 Long term (current) use of inhaled steroids: Principal | ICD-10-CM

## 2020-02-28 DIAGNOSIS — S52124A Nondisplaced fracture of head of right radius, initial encounter for closed fracture: Principal | ICD-10-CM

## 2020-02-28 DIAGNOSIS — J449 Chronic obstructive pulmonary disease, unspecified: Principal | ICD-10-CM

## 2020-02-28 DIAGNOSIS — Z79899 Other long term (current) drug therapy: Principal | ICD-10-CM

## 2020-02-28 DIAGNOSIS — B1920 Unspecified viral hepatitis C without hepatic coma: Principal | ICD-10-CM

## 2020-02-28 DIAGNOSIS — M25521 Pain in right elbow: Principal | ICD-10-CM

## 2020-02-28 MED ORDER — OXYCODONE 5 MG TABLET: 5 mg | tablet | Freq: Four times a day (QID) | 0 refills | 2 days | Status: AC

## 2020-02-28 MED ORDER — OXYCODONE 5 MG TABLET
ORAL_TABLET | Freq: Four times a day (QID) | ORAL | 0 refills | 2.00000 days | Status: CP | PRN
Start: 2020-02-28 — End: 2020-02-29

## 2020-02-29 ENCOUNTER — Emergency Department
Admit: 2020-02-29 | Discharge: 2020-02-29 | Disposition: A | Discharge status: Home / Self Care | Payer: PRIVATE HEALTH INSURANCE | Attending: Student in an Organized Health Care Education/Training Program

## 2020-02-29 ENCOUNTER — Ambulatory Visit
Admit: 2020-02-29 | Discharge: 2020-02-29 | Disposition: A | Discharge status: Home / Self Care | Payer: PRIVATE HEALTH INSURANCE | Attending: Student in an Organized Health Care Education/Training Program

## 2020-02-29 DIAGNOSIS — S52124A Nondisplaced fracture of head of right radius, initial encounter for closed fracture: Principal | ICD-10-CM

## 2020-02-29 DIAGNOSIS — J449 Chronic obstructive pulmonary disease, unspecified: Principal | ICD-10-CM

## 2020-02-29 DIAGNOSIS — B1920 Unspecified viral hepatitis C without hepatic coma: Principal | ICD-10-CM

## 2020-02-29 DIAGNOSIS — F17200 Nicotine dependence, unspecified, uncomplicated: Principal | ICD-10-CM

## 2020-02-29 DIAGNOSIS — M25521 Pain in right elbow: Principal | ICD-10-CM

## 2020-02-29 DIAGNOSIS — Z79899 Other long term (current) drug therapy: Principal | ICD-10-CM

## 2020-02-29 DIAGNOSIS — Z7951 Long term (current) use of inhaled steroids: Principal | ICD-10-CM

## 2020-02-29 MED ORDER — OXYCODONE 5 MG TABLET
ORAL_TABLET | Freq: Four times a day (QID) | ORAL | 0 refills | 2 days | Status: CP | PRN
Start: 2020-02-29 — End: 2020-03-05

## 2020-04-30 ENCOUNTER — Ambulatory Visit
Admit: 2020-04-30 | Payer: PRIVATE HEALTH INSURANCE | Attending: Student in an Organized Health Care Education/Training Program | Primary: Student in an Organized Health Care Education/Training Program

## 2020-07-06 ENCOUNTER — Ambulatory Visit: Admit: 2020-07-06 | Discharge: 2020-07-07 | Discharge status: Against Medical Advice | Payer: PRIVATE HEALTH INSURANCE

## 2020-07-18 MED ORDER — DOXYCYCLINE MONOHYDRATE 100 MG CAPSULE
0 days
Start: 2020-07-18 — End: 2020-07-23

## 2020-07-18 MED ORDER — METHYLPREDNISOLONE 4 MG TABLETS IN A DOSE PACK
0.00000 days
Start: 2020-07-18 — End: 2020-07-23

## 2020-07-18 MED ORDER — HYDROCODONE 5 MG-ACETAMINOPHEN 325 MG TABLET
Freq: Three times a day (TID) | ORAL | 0 days | PRN
Start: 2020-07-18 — End: ?

## 2020-07-23 ENCOUNTER — Ambulatory Visit: Admit: 2020-07-23 | Discharge: 2020-07-24 | Payer: PRIVATE HEALTH INSURANCE

## 2020-07-23 ENCOUNTER — Ambulatory Visit
Admit: 2020-07-23 | Discharge: 2020-07-24 | Payer: PRIVATE HEALTH INSURANCE | Attending: Student in an Organized Health Care Education/Training Program | Primary: Student in an Organized Health Care Education/Training Program

## 2020-07-23 DIAGNOSIS — Z1231 Encounter for screening mammogram for malignant neoplasm of breast: Principal | ICD-10-CM

## 2020-07-23 DIAGNOSIS — B182 Chronic viral hepatitis C: Principal | ICD-10-CM

## 2020-07-23 DIAGNOSIS — D649 Anemia, unspecified: Principal | ICD-10-CM

## 2020-07-23 DIAGNOSIS — K219 Gastro-esophageal reflux disease without esophagitis: Principal | ICD-10-CM

## 2020-07-23 DIAGNOSIS — Z13220 Encounter for screening for lipoid disorders: Principal | ICD-10-CM

## 2020-07-23 DIAGNOSIS — Z131 Encounter for screening for diabetes mellitus: Principal | ICD-10-CM

## 2020-07-23 DIAGNOSIS — Z1211 Encounter for screening for malignant neoplasm of colon: Principal | ICD-10-CM

## 2020-07-23 DIAGNOSIS — J449 Chronic obstructive pulmonary disease, unspecified: Principal | ICD-10-CM

## 2020-07-23 DIAGNOSIS — Z0000 Encounter for general adult medical examination without abnormal findings: Principal | ICD-10-CM

## 2020-07-23 DIAGNOSIS — M25561 Pain in right knee: Principal | ICD-10-CM

## 2020-07-23 DIAGNOSIS — M25562 Pain in left knee: Principal | ICD-10-CM

## 2020-07-23 DIAGNOSIS — M25579 Pain in unspecified ankle and joints of unspecified foot: Principal | ICD-10-CM

## 2020-07-23 DIAGNOSIS — J302 Other seasonal allergic rhinitis: Principal | ICD-10-CM

## 2020-07-23 MED ORDER — DICLOFENAC 1 % TOPICAL GEL
Freq: Four times a day (QID) | TOPICAL | 3 refills | 90 days | Status: CP
Start: 2020-07-23 — End: 2021-07-23

## 2020-07-23 MED ORDER — RABEPRAZOLE 20 MG TABLET,DELAYED RELEASE
ORAL_TABLET | Freq: Two times a day (BID) | ORAL | 1 refills | 90.00000 days | Status: CP
Start: 2020-07-23 — End: 2021-01-19

## 2020-07-23 MED ORDER — FLUTICASONE 100 MCG-SALMETEROL 50 MCG/DOSE BLISTR POWDR FOR INHALATION
Freq: Two times a day (BID) | RESPIRATORY_TRACT | 3 refills | 0.00000 days | Status: CP
Start: 2020-07-23 — End: 2021-07-23

## 2020-07-23 MED ORDER — ACETAMINOPHEN 500 MG TABLET
ORAL_TABLET | Freq: Three times a day (TID) | ORAL | 0 refills | 30 days | Status: CP | PRN
Start: 2020-07-23 — End: 2020-08-22

## 2020-07-23 MED ORDER — CETIRIZINE 10 MG TABLET
ORAL_TABLET | Freq: Every day | ORAL | 11 refills | 30 days | Status: CP
Start: 2020-07-23 — End: ?

## 2020-07-23 MED ORDER — OXYCODONE 5 MG TABLET
ORAL_TABLET | Freq: Three times a day (TID) | ORAL | 0 refills | 4.00000 days | Status: CP | PRN
Start: 2020-07-23 — End: ?

## 2020-07-23 MED ORDER — CYCLOBENZAPRINE 10 MG TABLET
ORAL_TABLET | Freq: Every evening | ORAL | 0 refills | 30.00000 days | Status: CN | PRN
Start: 2020-07-23 — End: ?

## 2020-07-23 MED ORDER — ALBUTEROL SULFATE HFA 90 MCG/ACTUATION AEROSOL INHALER
Freq: Four times a day (QID) | RESPIRATORY_TRACT | 2 refills | 0 days | Status: CP | PRN
Start: 2020-07-23 — End: ?

## 2020-07-27 ENCOUNTER — Ambulatory Visit: Admit: 2020-07-27 | Discharge: 2020-07-28 | Payer: PRIVATE HEALTH INSURANCE

## 2020-07-28 ENCOUNTER — Ambulatory Visit: Admit: 2020-07-28 | Discharge: 2020-07-29 | Payer: PRIVATE HEALTH INSURANCE

## 2020-07-28 DIAGNOSIS — S82891A Other fracture of right lower leg, initial encounter for closed fracture: Principal | ICD-10-CM

## 2020-07-28 MED ORDER — TRAMADOL 50 MG TABLET
ORAL_TABLET | Freq: Three times a day (TID) | ORAL | 0 refills | 5 days | Status: CP | PRN
Start: 2020-07-28 — End: 2020-08-02

## 2020-07-30 MED ORDER — TRAZODONE 150 MG TABLET
0 days
Start: 2020-07-30 — End: ?

## 2020-07-30 MED ORDER — ALPRAZOLAM 2 MG TABLET
0 days
Start: 2020-07-30 — End: ?

## 2020-08-12 ENCOUNTER — Ambulatory Visit: Admit: 2020-08-12 | Discharge: 2020-08-13 | Payer: PRIVATE HEALTH INSURANCE

## 2020-08-20 MED ORDER — OMEPRAZOLE 20 MG CAPSULE,DELAYED RELEASE
ORAL_CAPSULE | Freq: Every day | ORAL | 3 refills | 90.00000 days | Status: CP
Start: 2020-08-20 — End: 2021-08-20

## 2020-09-02 ENCOUNTER — Ambulatory Visit: Admit: 2020-09-02 | Discharge: 2020-09-03 | Payer: PRIVATE HEALTH INSURANCE

## 2020-09-02 MED ORDER — MELOXICAM 7.5 MG TABLET
ORAL_TABLET | Freq: Every day | ORAL | 0 refills | 30 days | Status: CP
Start: 2020-09-02 — End: 2021-09-02

## 2020-09-16 ENCOUNTER — Ambulatory Visit: Admit: 2020-09-16 | Discharge: 2020-09-17 | Payer: PRIVATE HEALTH INSURANCE

## 2020-09-30 DIAGNOSIS — S82891D Other fracture of right lower leg, subsequent encounter for closed fracture with routine healing: Principal | ICD-10-CM

## 2020-09-30 MED ORDER — MELOXICAM 7.5 MG TABLET
ORAL_TABLET | 0 refills | 0 days | Status: CP
Start: 2020-09-30 — End: ?

## 2020-10-28 ENCOUNTER — Ambulatory Visit: Admit: 2020-10-28 | Discharge: 2020-10-29 | Payer: PRIVATE HEALTH INSURANCE

## 2020-10-28 DIAGNOSIS — S82891D Other fracture of right lower leg, subsequent encounter for closed fracture with routine healing: Principal | ICD-10-CM

## 2020-12-06 ENCOUNTER — Ambulatory Visit: Admit: 2020-12-06 | Payer: PRIVATE HEALTH INSURANCE

## 2020-12-29 ENCOUNTER — Ambulatory Visit: Admit: 2020-12-29 | Payer: PRIVATE HEALTH INSURANCE

## 2021-01-13 ENCOUNTER — Ambulatory Visit: Admit: 2021-01-13 | Payer: PRIVATE HEALTH INSURANCE

## 2021-06-25 ENCOUNTER — Emergency Department
Admit: 2021-06-25 | Discharge: 2021-06-25 | Disposition: A | Discharge status: Home / Self Care | Payer: PRIVATE HEALTH INSURANCE

## 2021-06-25 ENCOUNTER — Ambulatory Visit
Admit: 2021-06-25 | Discharge: 2021-06-25 | Disposition: A | Discharge status: Home / Self Care | Payer: PRIVATE HEALTH INSURANCE

## 2021-06-25 DIAGNOSIS — M79642 Pain in left hand: Principal | ICD-10-CM

## 2021-07-02 DIAGNOSIS — F1010 Alcohol abuse, uncomplicated: Secondary | ICD-10-CM | POA: Insufficient documentation

## 2021-07-02 DIAGNOSIS — F1411 Cocaine abuse, in remission: Secondary | ICD-10-CM | POA: Insufficient documentation

## 2021-07-02 DIAGNOSIS — F419 Anxiety disorder, unspecified: Secondary | ICD-10-CM | POA: Insufficient documentation

## 2021-07-02 DIAGNOSIS — F4310 Post-traumatic stress disorder, unspecified: Secondary | ICD-10-CM | POA: Insufficient documentation

## 2022-05-22 ENCOUNTER — Ambulatory Visit (INDEPENDENT_AMBULATORY_CARE_PROVIDER_SITE_OTHER): Payer: Medicaid Other

## 2022-05-22 ENCOUNTER — Ambulatory Visit
Admission: EM | Admit: 2022-05-22 | Discharge: 2022-05-22 | Disposition: A | Discharge status: Home / Self Care | Payer: Medicaid Other

## 2022-05-22 DIAGNOSIS — G8929 Other chronic pain: Secondary | ICD-10-CM

## 2022-05-22 DIAGNOSIS — M19012 Primary osteoarthritis, left shoulder: Secondary | ICD-10-CM | POA: Diagnosis not present

## 2022-05-22 DIAGNOSIS — M79602 Pain in left arm: Secondary | ICD-10-CM

## 2022-05-22 DIAGNOSIS — M542 Cervicalgia: Secondary | ICD-10-CM

## 2022-05-22 DIAGNOSIS — M25522 Pain in left elbow: Secondary | ICD-10-CM | POA: Diagnosis not present

## 2022-05-22 HISTORY — DX: Chronic obstructive pulmonary disease, unspecified: J44.9

## 2022-05-22 HISTORY — DX: Post-traumatic stress disorder, unspecified: F43.10

## 2022-05-22 HISTORY — DX: Anxiety disorder, unspecified: F41.9

## 2022-05-22 MED ORDER — HYDROCODONE-ACETAMINOPHEN 5-325 MG PO TABS: 1.0000 | ORAL_TABLET | Freq: Three times a day (TID) | ORAL | 0 refills | Status: AC | PRN

## 2022-05-22 MED ORDER — DICLOFENAC SODIUM 75 MG PO TBEC: 75.0000 mg | DELAYED_RELEASE_TABLET | Freq: Two times a day (BID) | ORAL | 0 refills | Status: DC

## 2022-05-22 NOTE — ED Triage Notes (Signed)
Pt tripped over cat nearly one year ago, fell and required surgical repair to L elbow/humerus.  Did PT at home.  Has not been re-eval'ed since approx July-August.  Pain worsening and now radiating to neck.  Feels sensation of tingling or like elbow is leaking.

## 2022-05-22 NOTE — Discharge Instructions (Signed)
-  X-ray shows arthritis in your shoulder. - Pain could be related to a problem with your neck such as herniated or bulging disc, pinched nerve or could be related to everything that you went through with your previous fractures and surgery of the left arm. - I sent an anti-inflammatory medication and pain medication to pharmacy but you should follow-up with orthopedics.  You have a condition requiring you to follow up with Orthopedics so please call one of the following office for appointment:   [facility] [address] [place], [place] [address] Phone: [phone]  [facility] [address] [place], [place] [address] Phone: [phone]

## 2022-05-22 NOTE — ED Provider Notes (Signed)
[facility]    CSN: [id number] Arrival date & time: 05/22/22  1359      History   Chief Complaint Chief Complaint  Patient presents with   Arm Injury    HPI Natalie Hansen is a 52 y.o. female presenting for pain of the left arm which has worsened over the past month.  Patient reports that she had an accidental fall nearly a year ago and had to have orthopedic surgery on the left arm, specifically the elbow and humerus.  Reports that ever since then she has had continued pain but it was tolerable until the past month when symptoms acutely worsen.  She says she did have a mild fall about a month ago.  She reports pain in her neck all the way down to her hand.  States she feels a tingling sensation along the elbow and forearm.  Reports the most pain about the elbow and shoulder.  Slightly reduced range of motion of her shoulder and elbow.  No increased pain with movement of the neck.  Has tried OTC ibuprofen, Aleve and Tylenol without relief.  Has not followed up with orthopedics in close to 6 months and only performed home PT.  She has not had any pain in her chest, palpitations, shortness of breath.  Pain made worse with movements of her arm.  No other complaints.  HPI  Past Medical History:  Diagnosis Date   Anxiety    COPD (chronic obstructive pulmonary disease) (HCC)    PTSD (post-traumatic stress disorder)     There are no problems to display for this patient.   History reviewed. No pertinent surgical history.  OB History   No obstetric history on file.      Home Medications    Prior to Admission medications   Medication Sig Start Date End Date Taking? Authorizing Provider  ALPRAZolam (XANAX XR) 2 MG 24 hr tablet Take 2 mg by mouth 3 (three) times daily.   Yes [provider]  diclofenac (VOLTAREN) 75 MG EC tablet Take 1 tablet (75 mg total) by mouth 2 (two) times daily. 05/22/22 06/21/22 Yes Danton Clap, PA-C  HYDROcodone-acetaminophen  (NORCO/VICODIN) 5-325 MG tablet Take 1 tablet by mouth every 8 (eight) hours as needed for up to 5 days for severe pain. 05/22/22 05/27/22 Yes Danton Clap, PA-C  traZODone (DESYREL) 150 MG tablet Take 150 mg by mouth at bedtime.   Yes [provider]    Family History History reviewed. No pertinent family history.  Social History Social History   Tobacco Use   Smoking status: Every Day    Packs/day: .5    Types: Cigarettes     Allergies   Patient has no known allergies.   Review of Systems Review of Systems  Respiratory:  Negative for chest tightness and shortness of breath.   Cardiovascular:  Negative for chest pain and palpitations.  Musculoskeletal:  Positive for arthralgias, joint swelling and neck pain. Negative for back pain and neck stiffness.  Skin:  Negative for color change and wound.  Neurological:  Positive for numbness. Negative for dizziness and weakness.     Physical Exam Triage Vital Signs ED Triage Vitals [05/22/22 1544]  Enc Vitals Group     BP      Pulse      Resp      Temp      Temp src      SpO2      Weight  Height      Head Circumference      Peak Flow      Pain Score 10     Pain Loc      Pain Edu?      Excl. in [place]?    No data found.  Updated Vital Signs BP (!) 136/106 (BP Location: Right Arm)   Pulse 92   Temp 98.1 F (36.7 C) (Oral)   Resp 20   LMP  (Exact Date)   SpO2 96%   Physical Exam Vitals and nursing note reviewed.  Constitutional:      General: She is not in acute distress.    Appearance: Normal appearance. She is not ill-appearing or toxic-appearing.  HENT:     Head: Normocephalic and atraumatic.  Eyes:     General: No scleral icterus.       Right eye: No discharge.        Left eye: No discharge.     Conjunctiva/sclera: Conjunctivae normal.  Cardiovascular:     Rate and Rhythm: Normal rate and regular rhythm.     Pulses: Normal pulses.     Heart sounds: Normal heart sounds.  Pulmonary:      Effort: Pulmonary effort is normal. No respiratory distress.     Breath sounds: Normal breath sounds.  Musculoskeletal:     Left shoulder: Tenderness (Diffuse TTP anterior and posterior shoulder) present. No swelling, deformity or effusion. Decreased range of motion (slightly reduced in all directions).     Left elbow: Swelling (mild) present. Decreased range of motion. Tenderness present in radial head, medial epicondyle and lateral epicondyle.     Left forearm: Tenderness (mid radius) present. No swelling.     Cervical back: Neck supple. Tenderness (left paracervical muscles and left trapezius) present. No bony tenderness. No pain with movement. Normal range of motion.  Skin:    General: Skin is dry.  Neurological:     General: No focal deficit present.     Mental Status: She is alert. Mental status is at baseline.     Motor: No weakness.     Gait: Gait normal.  Psychiatric:        Mood and Affect: Mood normal.        Behavior: Behavior normal.        Thought Content: Thought content normal.      UC Treatments / Results  Labs (all labs ordered are listed, but only abnormal results are displayed) Labs Reviewed - No data to display  EKG   Radiology DG Humerus Left  Result Date: 05/22/2022 CLINICAL DATA:  Fracture of the humerus and radius 1 year ago requiring surgery. Persisting pain. EXAM: LEFT HUMERUS - 2 VIEW COMPARISON:  None Available. FINDINGS: Distal humerus and proximal ulna fractures with plating. Fractures appear healed with no hardware loosening or fracture. Glenohumeral osteoarthritis with joint space narrowing and inferior spurring. Mild spurring at the [facility] joint. IMPRESSION: 1. No acute finding. 2. Unremarkable hardware at the distal humerus and proximal ulna. 3. Glenohumeral osteoarthritis. Electronically Signed   By: Jorje Guild M.D.   On: 05/22/2022 16:20   DG Elbow Complete Left  Result Date: 05/22/2022 CLINICAL DATA:  Humerus and radius fracture 1 year ago  requiring surgery. Persisting pain. EXAM: LEFT ELBOW - COMPLETE 3+ VIEW COMPARISON:  None Available. FINDINGS: Distal humerus fracture with extensive plating. The fracture is healed with no visible hardware loosening. Proximal ulna fracture affecting the olecranon with intact hardware. No joint effusion, fracture, or subluxation. IMPRESSION:  Remote distal humerus and proximal ulna fractures with unremarkable hardware. Electronically Signed   By: Jorje Guild M.D.   On: 05/22/2022 16:19    Procedures Procedures (including critical care time)  Medications Ordered in UC Medications - No data to display  Initial Impression / Assessment and Plan / UC Course  I have reviewed the triage vital signs and the nursing notes.  Pertinent labs & imaging results that were available during my care of the patient were reviewed by me and considered in my medical decision making (see chart for details).   52 year old female presents for acute worsening of pain of the left arm after an accidental fall about a month ago.  Pains been worse over the past month.  History of orthopedic surgery on the humerus and radial head a year ago.  Tried OTC meds without relief.  Reports pain elevate on her left arm and neck as well as tingling of the forearm.  X-ray of the elbow and humerus obtained today shows no acute abnormality.  Remote fractures with unremarkable hardware.  Glenohumeral arthritis in her shoulder.  Unsure if patient's symptoms are due to fractures/injury or potentially she may have a neck condition causing radicular symptoms.  Patient says she cannot take corticosteroids.  Prescribed patient diclofenac.  Short supply of Norco given.  Reviewed supportive care.  Advised her to follow-up with orthopedics for further evaluation as she may need MRI or perhaps physical therapy, other measures, etc.  Final Clinical Impressions(s) / UC Diagnoses   Final diagnoses:  Left arm pain  Cervicalgia  Glenohumeral  arthritis, left  Chronic elbow pain, left     Discharge Instructions      -X-ray shows arthritis in your shoulder. - Pain could be related to a problem with your neck such as herniated or bulging disc, pinched nerve or could be related to everything that you went through with your previous fractures and surgery of the left arm. - I sent an anti-inflammatory medication and pain medication to pharmacy but you should follow-up with orthopedics.  You have a condition requiring you to follow up with Orthopedics so please call one of the following office for appointment:   [facility] [address] [place], [place] [address] Phone: [phone]  [facility] [address], [place], [place] [address] Phone: [phone]      ED Prescriptions     Medication Sig Dispense Auth. Provider   diclofenac (VOLTAREN) 75 MG EC tablet Take 1 tablet (75 mg total) by mouth 2 (two) times daily. 60 tablet Laurene Footman B, PA-C   HYDROcodone-acetaminophen (NORCO/VICODIN) 5-325 MG tablet Take 1 tablet by mouth every 8 (eight) hours as needed for up to 5 days for severe pain. 12 tablet Danton Clap, PA-C      I have reviewed the PDMP during this encounter.   Danton Clap, PA-C 05/22/22 1734

## 2022-06-02 ENCOUNTER — Encounter: Payer: Self-pay | Admitting: Family Medicine

## 2022-06-02 ENCOUNTER — Ambulatory Visit (INDEPENDENT_AMBULATORY_CARE_PROVIDER_SITE_OTHER): Payer: Medicaid Other | Admitting: Family Medicine

## 2022-06-02 VITALS — BP 130/78 | HR 89 | Wt 199.0 lb

## 2022-06-02 DIAGNOSIS — M67912 Unspecified disorder of synovium and tendon, left shoulder: Secondary | ICD-10-CM | POA: Diagnosis not present

## 2022-06-02 DIAGNOSIS — M5412 Radiculopathy, cervical region: Secondary | ICD-10-CM

## 2022-06-05 ENCOUNTER — Ambulatory Visit: Payer: Self-pay | Admitting: *Deleted

## 2022-06-05 NOTE — Telephone Encounter (Signed)
fyi

## 2022-06-05 NOTE — Telephone Encounter (Signed)
Summary: Possible Med reaction / Oxygen higher / Arm pain   Says Diclofenac makes her sick, her arm is hurting her, also says her oxygen  Best contact: [phone]           Chief Complaint: left arm pain, medication side effects from voltaren  Symptoms: nausea when taking voltaren and medication not effective for pain in left arm. C/o bilateral feet swelling at times but none now.  Reports O2 sat at 93% RA. Hx COPD.  Frequency: since May Pertinent Negatives: Patient denies fever. No severe pain no swelling  Disposition: [] ED /[] Urgent Care (no appt availability in office) / [] Appointment(In office/virtual)/ []  [facility]/ [x] Home Care/ [] Refused Recommended Disposition /[] [facility]/ []  Follow-up with PCP Additional Notes:  Please advise regarding medication side effects and if appt needed with Dr. Ashley Royalty.  Recommended if any other sx for leg swelling,  if returns, go to UC or ED . Not established patient at practice at this time until 06/12/22. Patient verbalized understanding .  Called [facility] to make aware attention for f/u from Dr. Ashley Royalty.        Reason for Disposition  [1] MODERATE pain (e.g., interferes with normal activities) AND [2] present > 3 days  Answer Assessment - Initial Assessment Questions 1. ONSET: "When did the pain start?"     Last May  2. LOCATION: "Where is the pain located?"     Left arm  3. PAIN: "How bad is the pain?" (Scale 1-10; or mild, moderate, severe)   - MILD (1-3): Doesn't interfere with normal activities.   - MODERATE (4-7): Interferes with normal activities (e.g., work or school) or awakens from sleep.   - SEVERE (8-10): Excruciating pain, unable to do any normal activities, unable to hold a cup of water.     Weakness when holding a cup 4. WORK OR EXERCISE: "Has there been any recent work or exercise that involved this part of the body?"     Na  5. CAUSE: "What do you think is causing the arm pain?"     Na   6. OTHER SYMPTOMS: "Do you have any other symptoms?" (e.g., neck pain, swelling, rash, fever, numbness, weakness)     Left arm pain , stiffness, weakness.  7. PREGNANCY: "Is there any chance you are pregnant?" "When was your last menstrual period?"     na  Protocols used: Arm Pain-A-AH

## 2022-06-06 NOTE — Telephone Encounter (Signed)
LVM to call back to make an appointment to see Dr Ashley Royalty

## 2022-06-06 NOTE — Telephone Encounter (Signed)
PC to pt, she is unable to get a ride here any earlier. 06/16/22 appointment made

## 2022-06-07 ENCOUNTER — Telehealth: Payer: Self-pay

## 2022-06-07 NOTE — Telephone Encounter (Signed)
Copied from CRM [id number]. Topic: General - Other >> Jun 07, 2022  1:39 PM Santiya F wrote: Reason for CRM: Pt is calling in returning a call from the office. Please advise.

## 2022-06-08 NOTE — Telephone Encounter (Signed)
Left pt voicemail, unsure who called and why

## 2022-06-12 ENCOUNTER — Other Ambulatory Visit: Payer: Self-pay | Admitting: Physician Assistant

## 2022-06-12 ENCOUNTER — Ambulatory Visit (INDEPENDENT_AMBULATORY_CARE_PROVIDER_SITE_OTHER): Payer: Medicaid Other | Admitting: Physician Assistant

## 2022-06-12 ENCOUNTER — Encounter: Payer: Self-pay | Admitting: Physician Assistant

## 2022-06-12 VITALS — BP 152/80 | HR 88 | Temp 98.9°F | Ht 67.0 in | Wt 190.0 lb

## 2022-06-12 DIAGNOSIS — J301 Allergic rhinitis due to pollen: Secondary | ICD-10-CM

## 2022-06-12 DIAGNOSIS — D649 Anemia, unspecified: Secondary | ICD-10-CM | POA: Insufficient documentation

## 2022-06-12 DIAGNOSIS — I10 Essential (primary) hypertension: Secondary | ICD-10-CM | POA: Diagnosis not present

## 2022-06-12 DIAGNOSIS — Z8619 Personal history of other infectious and parasitic diseases: Secondary | ICD-10-CM | POA: Insufficient documentation

## 2022-06-12 DIAGNOSIS — F17200 Nicotine dependence, unspecified, uncomplicated: Secondary | ICD-10-CM

## 2022-06-12 DIAGNOSIS — J449 Chronic obstructive pulmonary disease, unspecified: Secondary | ICD-10-CM | POA: Diagnosis not present

## 2022-06-12 DIAGNOSIS — J4489 Other specified chronic obstructive pulmonary disease: Secondary | ICD-10-CM | POA: Insufficient documentation

## 2022-06-12 DIAGNOSIS — J309 Allergic rhinitis, unspecified: Secondary | ICD-10-CM | POA: Insufficient documentation

## 2022-06-12 MED ORDER — MONTELUKAST SODIUM 10 MG PO TABS: 10.0000 mg | ORAL_TABLET | Freq: Every day | ORAL | 1 refills | Status: DC

## 2022-06-12 MED ORDER — VENTOLIN HFA 108 (90 BASE) MCG/ACT IN AERS: 2.0000 | INHALATION_SPRAY | RESPIRATORY_TRACT | 5 refills | Status: DC | PRN

## 2022-06-12 MED ORDER — FLUTICASONE PROPIONATE 50 MCG/ACT NA SUSP: 2.0000 | Freq: Every day | NASAL | 6 refills | Status: DC

## 2022-06-12 MED ORDER — BUDESONIDE-FORMOTEROL FUMARATE 160-4.5 MCG/ACT IN AERO: 2.0000 | INHALATION_SPRAY | Freq: Two times a day (BID) | RESPIRATORY_TRACT | 5 refills | Status: DC

## 2022-06-12 NOTE — Progress Notes (Addendum)
Date:  06/12/2022   Name:  Natalie Hansen   DOB:  Jul 27, 1970   MRN:  [id number]   Chief Complaint: Establish Care  HPI Natalie Hansen is a pleasant 52 year old female with a history of COPD, anemia, chronic hepatitis, SUD, and multiple behavioral health conditions who presents new to our practice today to establish care.    Mainly concerned with lungs today, apparently had PFTs in the distant past and has tried and failed several inhalers.  Currently using her mother's Ventolin, says that she dislikes anything that is powder-based.  She has smoked cigarettes since age 3, not interested in quitting at this time, focused on recovering from her recent left arm surgery which is being managed by Ortho as well as my colleague Dr. Joseph Berkshire MD.  She has not had a Pap smear since her hysterectomy years ago.  Complains of allergies, want something for this.  Says OTC antihistamines not working.  Behavioral health [facility] Dr. Janeece Riggers with visits every 3-57mo, next visit in July.     Recent Labs     Component Value Date/Time   NA 138 05/08/2011 1833   K 4.6 05/08/2011 1833   CL 102 05/08/2011 1833   CO2 31 05/08/2011 1833   GLUCOSE 97 05/08/2011 1833   BUN 18 05/08/2011 1833   CREATININE 1.02 05/08/2011 1833   CALCIUM 8.8 05/08/2011 1833   PROT 7.5 05/08/2011 1833   ALBUMIN 3.4 05/08/2011 1833   AST 31 05/08/2011 1833   ALT 47 05/08/2011 1833   ALKPHOS 108 05/08/2011 1833   BILITOT 0.3 05/08/2011 1833   GFRNONAA >60 05/08/2011 1833   GFRAA >60 05/08/2011 1833    Lab Results  Component Value Date   WBC 5.4 05/08/2011   HGB 13.9 05/08/2011   HCT 42.0 05/08/2011   MCV 96 05/08/2011   PLT 273 05/08/2011   No results found for: "HGBA1C" No results found for: "CHOL", "HDL", "LDLCALC", "LDLDIRECT", "TRIG", "CHOLHDL" No results found for: "TSH"  Review of Systems  Respiratory:  Positive for cough, shortness of breath and wheezing.   Cardiovascular:  Negative for chest  pain.  Musculoskeletal:  Positive for arthralgias and joint swelling.  Allergic/Immunologic: Positive for environmental allergies.    Patient Active Problem List   Diagnosis Date Noted   History of hepatitis 06/12/2022   Chronic anemia 06/12/2022   Allergic rhinitis 06/12/2022   PTSD (post-traumatic stress disorder) 07/02/2021   Anxiety 07/02/2021   Cocaine abuse in remission 07/02/2021   Ovarian mass 09/16/2012   Chronic airway obstruction 05/26/2011   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/2011   Gastritis and gastroduodenitis 07/28/2010   Tobacco use disorder 07/28/2010   Depressive disorder 07/28/2010    Allergies  Allergen Reactions   Prednisone Nausea Only    Severe nausea and vomiting   Tramadol Other (See Comments)    "I about went crazy" "it didn't adjust with my system"   Voltaren [Diclofenac] Nausea And Vomiting    Past Surgical History:  Procedure Laterality Date   ABDOMINAL HYSTERECTOMY     full    Social History   Tobacco Use   Smoking status: Every Day    Packs/day: 1.00    Years: 43.00    Additional pack years: 0.00    Total pack years: 43.00    Types: Cigarettes   Smokeless tobacco: Never  Vaping Use   Vaping Use: Never used  Substance Use Topics   Alcohol use: Not Currently   Drug use: Never  Medication list has been reviewed and updated.  Current Meds  Medication Sig   albuterol (PROVENTIL) (2.5 MG/3ML) 0.083% nebulizer solution Inhale into the lungs.   ALPRAZolam (XANAX XR) 2 MG 24 hr tablet Take 2 mg by mouth 3 (three) times daily.   budesonide-formoterol (SYMBICORT) 160-4.5 MCG/ACT inhaler Inhale into the lungs.   cyclobenzaprine (FLEXERIL) 10 MG tablet Take by mouth.   fluticasone-salmeterol (ADVAIR DISKUS) 250-50 MCG/ACT AEPB Inhale into the lungs.   ondansetron (ZOFRAN) 4 MG tablet Take by mouth.   potassium chloride (KLOR-CON 10) 10 MEQ tablet Take by mouth.   RABEprazole (ACIPHEX) 20 MG tablet Take by mouth.   tiotropium  (SPIRIVA HANDIHALER) 18 MCG inhalation capsule Place into inhaler and inhale.   traZODone (DESYREL) 150 MG tablet Take 150 mg by mouth at bedtime.   VENTOLIN HFA 108 (90 Base) MCG/ACT inhaler Inhale into the lungs.       06/12/2022    3:15 PM  GAD 7 : Generalized Anxiety Score  Nervous, Anxious, on Edge 1  Control/stop worrying 2  Worry too much - different things 2  Trouble relaxing 1  Restless 1  Easily annoyed or irritable 3  Afraid - awful might happen 0  Total GAD 7 Score 10  Anxiety Difficulty Somewhat difficult       06/12/2022    3:15 PM  Depression screen PHQ 2/9  Decreased Interest 3  Down, Depressed, Hopeless 0  PHQ - 2 Score 3  Altered sleeping 3  Tired, decreased energy 3  Change in appetite 3  Feeling bad or failure about yourself  0  Trouble concentrating 0  Moving slowly or fidgety/restless 3  Suicidal thoughts 0  PHQ-9 Score 15  Difficult doing work/chores Not difficult at all    BP Readings from Last 3 Encounters:  06/12/22 (!) 152/80  06/02/22 130/78  05/22/22 (!) 136/106    Wt Readings from Last 3 Encounters:  06/12/22 190 lb (86.2 kg)  06/02/22 199 lb (90.3 kg)    BP (!) 152/80 (BP Location: Right Arm, Patient Position: Sitting, Cuff Size: Large)   Pulse 88   Temp 98.9 F (37.2 C) (Oral)   Ht 5\' 7"  (1.702 m)   Wt 190 lb (86.2 kg)   LMP  (Exact Date)   SpO2 99%   BMI 29.76 kg/m   Physical Exam Vitals and nursing note reviewed.  Constitutional:      Appearance: Normal appearance.  Cardiovascular:     Rate and Rhythm: Normal rate and regular rhythm.     Heart sounds: Normal heart sounds.  Pulmonary:     Effort: Pulmonary effort is normal.     Breath sounds: Wheezing present. No rhonchi or rales.  Chest:     Chest wall: Tenderness present.  Skin:         Comments: Somewhat firm but mobile subcutaneous mass measuring roughly 2 cm, favoring sebaceous cyst vs lipoma.  Psychiatric:        Mood and Affect: Mood normal.         Behavior: Behavior normal.      Assessment and Plan:  1. Chronic obstructive pulmonary disease, unspecified COPD type Refill Ventolin for as needed use.  Emphasized importance of smoking cessation and COPD, information given.  Referring to pulmonology for further evaluation and management, consider repeat PFTs.  Also will try Symbicort inhaler for maintenance in the interim  - VENTOLIN HFA 108 (90 Base) MCG/ACT inhaler; Inhale 2 puffs into the lungs every 4 (four) hours  as needed for wheezing or shortness of breath.  Dispense: 1 each; Refill: 5 - Ambulatory referral to Pulmonology - budesonide-formoterol (SYMBICORT) 160-4.5 MCG/ACT inhaler; Inhale 2 puffs into the lungs in the morning and at bedtime. Use for COPD maintenance therapy. Rinse mouth after use  Dispense: 1 each; Refill: 5  2. Tobacco use disorder Emphasized importance of smoking cessation and COPD, information given.  Patient is not ready to quit - Ambulatory referral to Pulmonology  3. Non-seasonal allergic rhinitis due to pollen Try intranasal corticosteroid +/- montelukast - fluticasone (FLONASE) 50 MCG/ACT nasal spray; Place 2 sprays into both nostrils daily.  Dispense: 11.1 mL; Refill: 6 - montelukast (SINGULAIR) 10 MG tablet; Take 1 tablet (10 mg total) by mouth at bedtime.  Dispense: 30 tablet; Refill: 1  4. Hypertension, unspecified type Patient states that she is typically normotensive at home, not currently taking any antihypertensive medications.  We will continue to monitor for now with close follow-up     F/u 4 weeks fasting CPE with labs. Plan to do breast exam, pap, and order mammo/colo/LDCT at that time. Will ask who she sees for hepatitis, if anyone.   Partially dictated using Animal nutritionist. Any errors are unintentional.  Alvester Morin, PA-C, DMSc, Nutritionist [facility] Primary Care and Sports [facility] [phone]

## 2022-06-13 NOTE — Telephone Encounter (Signed)
Requested Prescriptions  Refused Prescriptions Disp Refills   montelukast (SINGULAIR) 10 MG tablet [Pharmacy Med Name: MONTELUKAST  TABLETS] 90 tablet     Sig: TAKE 1 TABLET(10 MG) BY MOUTH AT BEDTIME     Pulmonology:  Leukotriene Inhibitors Passed - 06/12/2022  6:41 PM      Passed - Valid encounter within last 12 months    Recent Outpatient Visits           Yesterday Chronic obstructive pulmonary disease, unspecified COPD type   [place] [facility] at [facility], Melton Alar, PA   1 week ago    [facility] Care & Sports Medicine at [facility], Ocie Bob, MD       Future Appointments             In 3 weeks Mordecai Maes, Melton Alar, PA [facility] Primary Care & Sports Medicine at [facility], [facility]

## 2022-06-14 ENCOUNTER — Ambulatory Visit: Payer: Medicaid Other | Admitting: Family Medicine

## 2022-06-16 ENCOUNTER — Ambulatory Visit: Payer: Medicaid Other | Admitting: Family Medicine

## 2022-06-18 DIAGNOSIS — M5412 Radiculopathy, cervical region: Secondary | ICD-10-CM | POA: Insufficient documentation

## 2022-06-18 DIAGNOSIS — M67912 Unspecified disorder of synovium and tendon, left shoulder: Secondary | ICD-10-CM | POA: Insufficient documentation

## 2022-06-18 NOTE — Assessment & Plan Note (Signed)
Chronic pain throughout left upper extremity with know degenerative changes and significant ipsilateral elbow reconstructive surgery with extensive hardware placement.  Exam does show weakness throughout the rotator cuff and radicular features.   - Discussed limitations on medication management given comorbid medical conditions - Stressed supportive care and close follow-up for consideration of corticosteroid injections (shoulder, trigger point around neck) - Can be used as diagnostic/therapeutic to direct next steps

## 2022-06-18 NOTE — Progress Notes (Signed)
     Primary Care / Sports Medicine Office Visit  Patient Information:  Patient ID: Natalie Hansen, female DOB: 16-Feb-1971 Age: 52 y.o. MRN: [id number]   Natalie Hansen is a pleasant 52 y.o. female presenting with the following:  Chief Complaint  Patient presents with   Neck Pain    Left side neck arm elbow     Vitals:   06/02/22 1058  BP: 130/78  Pulse: 89  SpO2: (!) 89%   Vitals:   06/02/22 1058  Weight: 199 lb (90.3 kg)   There is no height or weight on file to calculate BMI.     Independent interpretation of notes and tests performed by another provider:   None  Procedures performed:   None  Pertinent History, Exam, Impression, and Recommendations:   Natalie Hansen was seen today for neck pain.  Rotator cuff dysfunction, left Assessment & Plan: See additional assessment(s) for plan details.   Left cervical radiculopathy Overview: CT CERVICAL SPINE WITHOUT CONTRAST 07/02/2021 Spinal canal: No high grade spinal canal stenosis.  Multilevel degenerative  changes worst at C5-C6 and C6-C7   Assessment & Plan: Chronic pain throughout left upper extremity with know degenerative changes and significant ipsilateral elbow reconstructive surgery with extensive hardware placement.  Exam does show weakness throughout the rotator cuff and radicular features.   - Discussed limitations on medication management given comorbid medical conditions - Stressed supportive care and close follow-up for consideration of corticosteroid injections (shoulder, trigger point around neck) - Can be used as diagnostic/therapeutic to direct next steps    I provided a total time of 45 minutes including both face-to-face and non-face-to-face time on 06/18/2022 inclusive of time utilized for medical chart review, information gathering, care coordination with staff, and documentation completion.   Orders & Medications No orders of the defined types were placed in this encounter.  No orders of  the defined types were placed in this encounter.    Return in about 1 week (around 06/09/2022).     [name], MD, [facility]   Primary Care Sports Medicine Primary Care and Sports Medicine at [facility]

## 2022-06-18 NOTE — Assessment & Plan Note (Signed)
See additional assessment(s) for plan details. 

## 2022-06-19 ENCOUNTER — Telehealth: Payer: Self-pay

## 2022-06-19 NOTE — Telephone Encounter (Signed)
PA completed waiting of insurance approval.  Key: [id number]  KP

## 2022-06-21 ENCOUNTER — Other Ambulatory Visit: Payer: Self-pay | Admitting: Physician Assistant

## 2022-06-21 DIAGNOSIS — J449 Chronic obstructive pulmonary disease, unspecified: Secondary | ICD-10-CM

## 2022-06-21 MED ORDER — SYMBICORT 160-4.5 MCG/ACT IN AERO: 2.0000 | INHALATION_SPRAY | Freq: Two times a day (BID) | RESPIRATORY_TRACT | 5 refills | Status: DC

## 2022-06-22 NOTE — Telephone Encounter (Signed)
Denied  KP 

## 2022-06-27 ENCOUNTER — Ambulatory Visit (INDEPENDENT_AMBULATORY_CARE_PROVIDER_SITE_OTHER): Payer: Medicaid Other | Admitting: Family Medicine

## 2022-06-27 ENCOUNTER — Other Ambulatory Visit (INDEPENDENT_AMBULATORY_CARE_PROVIDER_SITE_OTHER): Payer: Medicaid Other | Admitting: Radiology

## 2022-06-27 ENCOUNTER — Encounter: Payer: Self-pay | Admitting: Family Medicine

## 2022-06-27 VITALS — BP 150/80 | HR 100 | Ht 67.0 in | Wt 188.0 lb

## 2022-06-27 DIAGNOSIS — M67912 Unspecified disorder of synovium and tendon, left shoulder: Secondary | ICD-10-CM

## 2022-06-27 DIAGNOSIS — M19012 Primary osteoarthritis, left shoulder: Secondary | ICD-10-CM | POA: Insufficient documentation

## 2022-06-27 MED ORDER — HYDROCODONE-ACETAMINOPHEN 5-325 MG PO TABS: 1.0000 | ORAL_TABLET | Freq: Three times a day (TID) | ORAL | 0 refills | Status: DC | PRN

## 2022-06-27 MED ORDER — TRIAMCINOLONE ACETONIDE 40 MG/ML IJ SUSP
80.0000 mg | Freq: Once | INTRAMUSCULAR | Status: AC
  Administered 2022-06-27: 80 mg via INTRAMUSCULAR

## 2022-06-27 NOTE — Patient Instructions (Signed)
You have just been given a cortisone injection to reduce pain and inflammation. After the injection you may notice immediate relief of pain as a result of the Lidocaine. It is important to rest the area of the injection for 24 to 48 hours after the injection. There is a possibility of some temporary increased discomfort and swelling for up to 72 hours until the cortisone begins to work. If you do have pain, simply rest the joint and use ice. If you can tolerate over the counter medications, you can try Tylenol, Aleve, or Advil for added relief per package instructions. - Can dose hydrocodone as-needed until symptoms respond - Contact us at 2 weeks if still symptomatic - Follow-up as-needed

## 2022-06-27 NOTE — Progress Notes (Signed)
     Primary Care / Sports Medicine Office Visit  Patient Information:  Patient ID: Natalie Hansen, female DOB: Aug 08, 1970 Age: 52 y.o. MRN: [id number]   Natalie Hansen is a pleasant 52 y.o. female presenting with the following:  Chief Complaint  Patient presents with   Shoulder Pain    Left injection    Vitals:   06/27/22 1345  BP: (!) 150/80  Pulse: 100  SpO2: 96%   Vitals:   06/27/22 1345  Weight: 188 lb (85.3 kg)  Height: 5\' 7"  (1.702 m)   Body mass index is 29.44 kg/m.  No results found.   Independent interpretation of notes and tests performed by another provider:   None  Procedures performed:   Procedure:  Injection of left subacromial shoulder under ultrasound guidance. Ultrasound guidance used for in-plane approach, sonographic character of tendon consistent with tendinopathy Samsung HS60 device utilized with permanent recording / reporting. Verbal informed consent obtained and verified. Skin prepped in a sterile fashion. Ethyl chloride for topical local analgesia.  Completed without difficulty and tolerated well. Medication: triamcinolone acetonide 40 mg/mL suspension for injection 1 mL total and 2 mL lidocaine 1% without epinephrine utilized for needle placement anesthetic Advised to contact for fevers/chills, erythema, induration, drainage, or persistent bleeding.  Procedure:  Injection of left glenohumeral joint under ultrasound guidance. Ultrasound guidance utilized for out-of-plane approach, dynamic joint movement visualized, cortical irregularity consistent with arthropathy noted Samsung HS60 device utilized with permanent recording / reporting. Verbal informed consent obtained and verified. Skin prepped in a sterile fashion. Ethyl chloride for topical local analgesia.  Completed without difficulty and tolerated well. Medication: triamcinolone acetonide 40 mg/mL suspension for injection 1 mL total and 2 mL lidocaine 1% without epinephrine  utilized for needle placement anesthetic Advised to contact for fevers/chills, erythema, induration, drainage, or persistent bleeding.   Pertinent History, Exam, Impression, and Recommendations:   Natalie Hansen was seen today for shoulder pain.  Osteoarthritis of left glenohumeral joint Assessment & Plan: See additional assessment(s) for plan details.  Orders: -     [place] LIMITED JOINT SPACE STRUCTURES UP LEFT; Future -     Triamcinolone Acetonide  Rotator cuff dysfunction, left Overview: Ongoing symptoms of RC dysfunction in the setting of glenohumeral OA (noted on humerus x-rays), ipsilateral significant elbow reconstructive surgery, and question cervical radiculopathy component.  - Performed diagnostic / therapeutic injections to both the subacromial and glenohumeral spaces - Post-care reviewed, interim PRN pain control measures - Contact at 2 weeks for status update if still symptomatic - Referral to pain & spine at that point if indicated  Assessment & Plan: See additional assessment(s) for plan details.  Orders: -     [place] LIMITED JOINT SPACE STRUCTURES UP LEFT; Future -     Triamcinolone Acetonide     Orders & Medications Meds ordered this encounter  Medications   triamcinolone acetonide (KENALOG-40) injection 80 mg   Orders Placed This Encounter  Procedures   [place] LIMITED JOINT SPACE STRUCTURES UP LEFT     No follow-ups on file.     [name], MD, [facility]   Primary Care Sports Medicine Primary Care and Sports Medicine at [facility]

## 2022-06-27 NOTE — Assessment & Plan Note (Signed)
See additional assessment(s) for plan details. 

## 2022-06-30 ENCOUNTER — Telehealth: Payer: Self-pay | Admitting: Physician Assistant

## 2022-06-30 NOTE — Telephone Encounter (Signed)
Please review.  KP

## 2022-06-30 NOTE — Telephone Encounter (Signed)
Not coming from [facility], might be [facility] who is also seeing her

## 2022-06-30 NOTE — Telephone Encounter (Signed)
Copied from CRM [id number]. Topic: General - Other >> Jun 30, 2022 12:48 PM Turkey B wrote: Reason for CRM: pt called in about getting cat scan of her head and shoulders. She missed a call, says thinks thts what this is abot. Please call back to clarify

## 2022-06-30 NOTE — Telephone Encounter (Signed)
Called pt told her that we are unsure of who called her. Pt verbalized understanding. Pt asked about a lung screening. Told pt at her CPE that will be ordered.  KP

## 2022-07-05 ENCOUNTER — Telehealth: Payer: Self-pay | Admitting: Physician Assistant

## 2022-07-05 NOTE — Telephone Encounter (Signed)
FYI

## 2022-07-05 NOTE — Telephone Encounter (Signed)
Copied from CRM [id number]. Topic: General - Other >> Jul 05, 2022 11:35 AM Macon Large wrote: Reason for CRM: Pt requests that a message be sent to Dr. Ashley Royalty advising that she is still having pain but she has been doing better since having the shot.

## 2022-07-07 NOTE — Telephone Encounter (Signed)
Called pt went over message from Dr. Ashley Royalty. Pt verbalized understanding. Pt asked if she can use lidocaine patches. Spoke to Dr. Ashley Royalty he stated it was ok to use. Pt verbalized understanding.  KP

## 2022-07-10 ENCOUNTER — Encounter: Payer: Self-pay | Admitting: Physician Assistant

## 2022-07-10 ENCOUNTER — Other Ambulatory Visit: Payer: Self-pay | Admitting: Physician Assistant

## 2022-07-10 ENCOUNTER — Ambulatory Visit (INDEPENDENT_AMBULATORY_CARE_PROVIDER_SITE_OTHER): Payer: Medicaid Other | Admitting: Physician Assistant

## 2022-07-10 VITALS — BP 114/78 | HR 97 | Temp 99.3°F | Ht 67.0 in | Wt 187.0 lb

## 2022-07-10 DIAGNOSIS — Z13220 Encounter for screening for lipoid disorders: Secondary | ICD-10-CM

## 2022-07-10 DIAGNOSIS — Z86010 Personal history of colonic polyps: Secondary | ICD-10-CM

## 2022-07-10 DIAGNOSIS — Z0001 Encounter for general adult medical examination with abnormal findings: Secondary | ICD-10-CM | POA: Diagnosis not present

## 2022-07-10 DIAGNOSIS — K529 Noninfective gastroenteritis and colitis, unspecified: Secondary | ICD-10-CM

## 2022-07-10 DIAGNOSIS — Z1211 Encounter for screening for malignant neoplasm of colon: Secondary | ICD-10-CM

## 2022-07-10 DIAGNOSIS — Z1231 Encounter for screening mammogram for malignant neoplasm of breast: Secondary | ICD-10-CM

## 2022-07-10 DIAGNOSIS — Z122 Encounter for screening for malignant neoplasm of respiratory organs: Secondary | ICD-10-CM

## 2022-07-10 DIAGNOSIS — Z87898 Personal history of other specified conditions: Secondary | ICD-10-CM

## 2022-07-10 DIAGNOSIS — Z23 Encounter for immunization: Secondary | ICD-10-CM

## 2022-07-10 DIAGNOSIS — Z0000 Encounter for general adult medical examination without abnormal findings: Secondary | ICD-10-CM

## 2022-07-10 DIAGNOSIS — Z8619 Personal history of other infectious and parasitic diseases: Secondary | ICD-10-CM

## 2022-07-10 NOTE — Patient Instructions (Signed)
-  It was a pleasure to see you today! Please review your visit summary for helpful information -Lab results are usually available within 1-2 days and we will call once reviewed -I would encourage you to follow your care via MyChart where you can access lab results, notes, messages, and more -If you feel that we did a nice job today, please complete your after-visit survey and leave us a Google review! Your CMA today was Kieandra and your provider was Dan Delta Pichon, PA-C, DMSc -Please return for follow-up in about 1 month  

## 2022-07-10 NOTE — Progress Notes (Unsigned)
Date:  07/10/2022   Name:  Natalie Hansen   DOB:  23-Dec-1970   MRN:  [id number]   Chief Complaint: Annual Exam  HPI Natalie Hansen is a 52 y.o. female who presents today for her Complete Annual Exam. She feels fairly well. She reports exercising walking . She reports she is sleeping well. Breast complaints none.  Overdue for all cancer screenings. Heavy smoking history. Colonoscopy in 2016 with polyps and recommended repeat 2021. Does not think she has ever had LDCT. Last mammo 2016 with abnormality requiring biopsy but found to be benign cyst. Patient says she does not have a cervix after total hysterectomy.   COPD without recent PFTs, still smoking, 43 pack-years. Referred last visit to pulmonology. Also started Symbicort and Singulair which may have helped some. Ventolin PRN.   Complains about arm/shoulder pain, which is managed by my colleague Dr. Joseph Berkshire. She is running low on hydrocodone but has been informed we do not prescribe opioids for chronic pain. Some suspicion of cervicalgia as the cause for her arm pain.   She complains of chronic diarrhea, uncertain etiology  Also mentions headaches, wonders if she hit her head when she fell a year ago. Made no mention of head injury at the time of fall, no imaging obtained.   Health Maintenance Due  Topic Date Due   PAP SMEAR-Modifier  Never done   COLONOSCOPY (Pts 45-7yrs Insurance coverage will need to be confirmed)  08/19/2019   Lung Cancer Screening  Never done   MAMMOGRAM  Never done   COVID-19 Vaccine (3 - 2023-24 season) 10/28/2021    Immunization History  Administered Date(s) Administered   Dtap, Unspecified 01/16/1972, 03/12/1972, 05/08/1972, 05/13/1973   Hepb-cpg 11/08/2017   Influenza, Quadrivalent, Recombinant, Inj, Pf 03/30/2020   Influenza,inj,Quad PF,6+ Mos 12/14/2014   Influenza-Unspecified 01/18/2009   Measles 01/16/1972   Moderna Sars-Covid-2 Vaccination 06/12/2019, 07/11/2019   Mumps 10/17/1976    Pneumococcal Polysaccharide-23 12/14/2014   Polio, Unspecified 01/16/1972, 03/12/1972, 05/08/1972, 05/13/1973   Rubella 01/16/1972   Tdap 07/02/2021   Zoster Recombinat (Shingrix) 07/10/2022       Medication list has been reviewed and updated.  Current Meds  Medication Sig   albuterol (PROVENTIL) (2.5 MG/3ML) 0.083% nebulizer solution Inhale into the lungs.   ALPRAZolam (XANAX XR) 2 MG 24 hr tablet Take 2 mg by mouth 3 (three) times daily.   fluticasone (FLONASE) 50 MCG/ACT nasal spray Place 2 sprays into both nostrils daily.   HYDROcodone-acetaminophen (NORCO/VICODIN) 5-325 MG tablet Take 1 tablet by mouth every 8 (eight) hours as needed for severe pain.   montelukast (SINGULAIR) 10 MG tablet Take 1 tablet (10 mg total) by mouth at bedtime.   SYMBICORT 160-4.5 MCG/ACT inhaler Inhale 2 puffs into the lungs in the morning and at bedtime. Use for COPD maintenance therapy. Rinse mouth after use.   traZODone (DESYREL) 150 MG tablet Take 150 mg by mouth at bedtime.   VENTOLIN HFA 108 (90 Base) MCG/ACT inhaler Inhale 2 puffs into the lungs every 4 (four) hours as needed for wheezing or shortness of breath.   [DISCONTINUED] ondansetron (ZOFRAN) 4 MG tablet Take by mouth.     Review of Systems  Constitutional:  Negative for appetite change, fatigue and unexpected weight change.  HENT:  Negative for hearing loss and mouth sores.   Eyes:  Negative for visual disturbance.  Respiratory:  Positive for cough and wheezing. Negative for chest tightness and shortness of breath.   Cardiovascular:  Negative  for chest pain, palpitations and leg swelling.  Gastrointestinal:  Negative for abdominal distention, abdominal pain, blood in stool, constipation and diarrhea.  Endocrine: Negative for polydipsia, polyphagia and polyuria.  Genitourinary:  Negative for difficulty urinating, dysuria, hematuria, menstrual problem, vaginal bleeding and vaginal discharge.  Musculoskeletal:  Positive for arthralgias  and back pain. Negative for gait problem.  Skin:  Negative for rash and wound.  Neurological:  Positive for headaches. Negative for dizziness, syncope, weakness and numbness.  Psychiatric/Behavioral:  Negative for behavioral problems and sleep disturbance.     Patient Active Problem List   Diagnosis Date Noted   History of intravenous drug use 07/10/2022   Osteoarthritis of left glenohumeral joint 06/27/2022   Rotator cuff dysfunction, left 06/18/2022   Left cervical radiculopathy 06/18/2022   History of hepatitis 06/12/2022   Chronic anemia 06/12/2022   Allergic rhinitis 06/12/2022   Hypertension 06/12/2022   PTSD (post-traumatic stress disorder) 07/02/2021   Anxiety 07/02/2021   Cocaine abuse in remission (HCC) 07/02/2021   ETOH abuse 07/02/2021   At risk for long QT syndrome 07/19/2017   Ovarian mass 09/16/2012   Chronic airway obstruction (HCC) 05/26/2011   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/2011   Gastritis and gastroduodenitis 07/28/2010   Tobacco use disorder 07/28/2010   Depressive disorder 07/28/2010   Chronic hepatitis C (HCC) 07/28/2010    Allergies  Allergen Reactions   Prednisone Nausea Only    Severe nausea and vomiting   Tramadol Other (See Comments)    "I about went crazy" "it didn't adjust with my system"   Voltaren [Diclofenac] Nausea And Vomiting    Immunization History  Administered Date(s) Administered   Dtap, Unspecified 01/16/1972, 03/12/1972, 05/08/1972, 05/13/1973   Hepb-cpg 11/08/2017   Influenza, Quadrivalent, Recombinant, Inj, Pf 03/30/2020   Influenza,inj,Quad PF,6+ Mos 12/14/2014   Influenza-Unspecified 01/18/2009   Measles 01/16/1972   Moderna Sars-Covid-2 Vaccination 06/12/2019, 07/11/2019   Mumps 10/17/1976   Pneumococcal Polysaccharide-23 12/14/2014   Polio, Unspecified 01/16/1972, 03/12/1972, 05/08/1972, 05/13/1973   Rubella 01/16/1972   Tdap 07/02/2021   Zoster Recombinat (Shingrix) 07/10/2022    Past Surgical History:   Procedure Laterality Date   ABDOMINAL HYSTERECTOMY     Total, ovarian cancer    Social History   Tobacco Use   Smoking status: Every Day    Packs/day: 1.00    Years: 43.00    Additional pack years: 0.00    Total pack years: 43.00    Types: Cigarettes   Smokeless tobacco: Never  Vaping Use   Vaping Use: Never used  Substance Use Topics   Alcohol use: Not Currently   Drug use: Never    Family History  Problem Relation Age of Onset   Hypertension Mother    Hypertension Father    Cancer Maternal Grandmother         07/10/2022    1:51 PM 06/12/2022    3:15 PM  GAD 7 : Generalized Anxiety Score  Nervous, Anxious, on Edge 1 1  Control/stop worrying 1 2  Worry too much - different things 1 2  Trouble relaxing 1 1  Restless 1 1  Easily annoyed or irritable 2 3  Afraid - awful might happen 0 0  Total GAD 7 Score 7 10  Anxiety Difficulty Not difficult at all Somewhat difficult       07/10/2022    1:51 PM 06/12/2022    3:15 PM  Depression screen PHQ 2/9  Decreased Interest 1 3  Down, Depressed, Hopeless 0 0  PHQ -  2 Score 1 3  Altered sleeping 0 3  Tired, decreased energy 1 3  Change in appetite 0 3  Feeling bad or failure about yourself  0 0  Trouble concentrating 0 0  Moving slowly or fidgety/restless 1 3  Suicidal thoughts 0 0  PHQ-9 Score 3 15  Difficult doing work/chores Not difficult at all Not difficult at all    BP Readings from Last 3 Encounters:  07/10/22 114/78  06/27/22 (!) 150/80  06/12/22 (!) 152/80    Wt Readings from Last 3 Encounters:  07/10/22 187 lb (84.8 kg)  06/27/22 188 lb (85.3 kg)  06/12/22 190 lb (86.2 kg)    BP 114/78   Pulse 97   Temp 99.3 F (37.4 C) (Oral)   Ht 5\' 7"  (1.702 m)   Wt 187 lb (84.8 kg)   SpO2 96%   BMI 29.29 kg/m   Physical Exam Vitals and nursing note reviewed. Exam conducted with a chaperone present Mariann Barter Person, CMA).  Constitutional:      Appearance: Normal appearance. She is well-groomed.   HENT:     Ears:     Comments: EAC clear bilaterally with good view of TM which is without effusion or erythema.     Nose: Nose normal.     Mouth/Throat:     Mouth: Mucous membranes are moist. No oral lesions.     Dentition: Normal dentition.     Pharynx: Uvula midline. No posterior oropharyngeal erythema.  Eyes:     General: Vision grossly intact.     Extraocular Movements: Extraocular movements intact.     Conjunctiva/sclera: Conjunctivae normal.     Pupils: Pupils are equal, round, and reactive to light.  Neck:     Thyroid: No thyroid mass or thyromegaly.  Cardiovascular:     Rate and Rhythm: Normal rate and regular rhythm.     Heart sounds: S1 normal and S2 normal. No murmur heard.    No friction rub. No gallop.     Comments: Pulses 2+ at radial, PT, DP bilaterally. No carotid bruit. No peripheral edema Pulmonary:     Effort: Pulmonary effort is normal.     Breath sounds: Wheezing present.  Chest:     Comments: Breast exam typical for age without suspicious masses, asymmetry, or lymphadenopathy. Generalized breast tenderness bilaterally.  Abdominal:     General: Bowel sounds are normal.     Palpations: Abdomen is soft. There is no mass.     Tenderness: There is no abdominal tenderness.  Genitourinary:    Comments: Pelvic exam deferred. Will closely review surgical/OBGYN history to confirm absence of cervix.  Musculoskeletal:     Comments: Full ROM with strength 5/5 bilateral upper and lower extremities  Lymphadenopathy:     Cervical: No cervical adenopathy.  Skin:    General: Skin is warm.     Capillary Refill: Capillary refill takes less than 2 seconds.     Findings: No lesion or rash.  Neurological:     Mental Status: She is alert and oriented to person, place, and time.     Cranial Nerves: Cranial nerves 2-12 are intact.     Gait: Gait is intact.  Psychiatric:        Mood and Affect: Mood and affect normal.        Behavior: Behavior normal.     Recent Labs      Component Value Date/Time   NA 138 05/08/2011 1833   K 4.6 05/08/2011 1833   CL 102  05/08/2011 1833   CO2 31 05/08/2011 1833   GLUCOSE 97 05/08/2011 1833   BUN 18 05/08/2011 1833   CREATININE 1.02 05/08/2011 1833   CALCIUM 8.8 05/08/2011 1833   PROT 7.5 05/08/2011 1833   ALBUMIN 3.4 05/08/2011 1833   AST 31 05/08/2011 1833   ALT 47 05/08/2011 1833   ALKPHOS 108 05/08/2011 1833   BILITOT 0.3 05/08/2011 1833   GFRNONAA >60 05/08/2011 1833   GFRAA >60 05/08/2011 1833    Lab Results  Component Value Date   WBC 5.4 05/08/2011   HGB 13.9 05/08/2011   HCT 42.0 05/08/2011   MCV 96 05/08/2011   PLT 273 05/08/2011   No results found for: "HGBA1C" No results found for: "CHOL", "HDL", "LDLCALC", "LDLDIRECT", "TRIG", "CHOLHDL" No results found for: "TSH"   Assessment and Plan:  1. Annual physical exam Fairly healthy patient with few abnormalities on exam.  Smoking cessation remains a primary target for her overall health improvement.  Obtain baseline labs today, arrange for routine screenings.   Many chronic complaints necessitating more frequent visits to adequately address.   - CBC with Differential/Platelet - Comprehensive metabolic panel - TSH  2. Chronic diarrhea Check hepatitis status given her history of hepatitis C and IVDA.  Hep C RNA quant was negative 07/05/2021 - Hepatitis, Acute  3. Hypomagnesemia History of hypomagnesemia, recheck today. - Magnesium  4. Encounter for screening mammogram for breast cancer No particular areas of concern on breast exam today.  Given her history of abnormal mammogram, she is long overdue.  Ordering mammogram today - MM 3D SCREENING MAMMOGRAM BILATERAL BREAST  5. Screening for lung cancer In addition to establishing care with pulmonology for PFTs, I would like for her to obtain LDCT as annual lung cancer screening given her extensive 43 pack-year smoking history - Ambulatory Referral Lung Cancer Screening [facility]  6.  Screening for colon cancer Overdue for colonoscopy, referring today - Ambulatory referral to Gastroenterology  7. History of colon polyps Overdue for colonoscopy, referring today - Ambulatory referral to Gastroenterology  8. History of hepatitis Obtain hepatitis panel - Hepatitis, Acute  9. Screening for hyperlipidemia Obtain nonfasting lipid panel. - Lipid panel  10. Need for shingles vaccine Shingles #1 completed today - Varicella-zoster vaccine IM    Return in about 4 weeks (around 08/07/2022) for OV chronic conditions.   Partially dictated using Animal nutritionist. Any errors are unintentional.  Alvester Morin, PA-C, DMSc, Nutritionist [facility] [phone]

## 2022-07-11 ENCOUNTER — Encounter: Payer: Self-pay | Admitting: Physician Assistant

## 2022-07-11 ENCOUNTER — Other Ambulatory Visit: Payer: Self-pay

## 2022-07-11 DIAGNOSIS — D649 Anemia, unspecified: Secondary | ICD-10-CM

## 2022-07-11 LAB — COMPREHENSIVE METABOLIC PANEL
ALT: 15 IU/L (ref 0–32)
AST: 19 IU/L (ref 0–40)
Albumin/Globulin Ratio: 1.6 (ref 1.2–2.2)
Albumin: 4.5 g/dL (ref 3.8–4.9)
Alkaline Phosphatase: 112 IU/L (ref 44–121)
BUN/Creatinine Ratio: 36 — ABNORMAL HIGH (ref 9–23)
BUN: 30 mg/dL — ABNORMAL HIGH (ref 6–24)
Bilirubin Total: 0.5 mg/dL (ref 0.0–1.2)
CO2: 21 mmol/L (ref 20–29)
Calcium: 8.9 mg/dL (ref 8.7–10.2)
Chloride: 101 mmol/L (ref 96–106)
Creatinine, Ser: 0.84 mg/dL (ref 0.57–1.00)
Globulin, Total: 2.8 g/dL (ref 1.5–4.5)
Glucose: 92 mg/dL (ref 70–99)
Potassium: 4.6 mmol/L (ref 3.5–5.2)
Sodium: 139 mmol/L (ref 134–144)
Total Protein: 7.3 g/dL (ref 6.0–8.5)
eGFR: 84 mL/min/{1.73_m2} (ref >59)

## 2022-07-11 LAB — CBC WITH DIFFERENTIAL/PLATELET

## 2022-07-11 LAB — LIPID PANEL
Chol/HDL Ratio: 2.5 ratio (ref 0.0–4.4)
Cholesterol, Total: 182 mg/dL (ref 100–199)
HDL: 74 mg/dL (ref >39)
LDL Chol Calc (NIH): 95 mg/dL (ref 0–99)
Triglycerides: 70 mg/dL (ref 0–149)
VLDL Cholesterol Cal: 13 mg/dL (ref 5–40)

## 2022-07-11 LAB — MAGNESIUM: Magnesium: 2.1 mg/dL (ref 1.6–2.3)

## 2022-07-11 LAB — TSH: TSH: 1.19 u[IU]/mL (ref 0.450–4.500)

## 2022-07-12 LAB — ACUTE VIRAL HEPATITIS (HAV, HBV, HCV)
HCV Ab: REACTIVE — AB
Hep A IgM: NEGATIVE
Hep B C IgM: NEGATIVE
Hepatitis B Surface Ag: NEGATIVE

## 2022-07-12 LAB — HCV RT-PCR, QUANT (NON-GRAPH): Hepatitis C Quantitation: NOT DETECTED IU/mL

## 2022-07-18 ENCOUNTER — Institutional Professional Consult (permissible substitution): Payer: Medicaid Other | Admitting: Student in an Organized Health Care Education/Training Program

## 2022-07-19 ENCOUNTER — Encounter: Payer: Self-pay | Admitting: *Deleted

## 2022-07-20 ENCOUNTER — Encounter: Payer: Self-pay | Admitting: Physician Assistant

## 2022-07-26 ENCOUNTER — Telehealth: Payer: Self-pay | Admitting: Physician Assistant

## 2022-07-26 NOTE — Telephone Encounter (Signed)
Copied from CRM [id number]. Topic: Appointment Scheduling - Scheduling Inquiry for Clinic >> Jul 26, 2022  2:50 PM Turkey B wrote: Reason for CRM: pt called in wanting to know if Dr Mordecai Maes is ok with her cancelling her appt on June 12 , or does she still need to come in, because she was told that her mammogram needs to be done first before this appt

## 2022-07-26 NOTE — Telephone Encounter (Signed)
Called pt let her know she should keep the appointment. Pt verbalized understanding. Pt does not need to have her mammogram before her appointment.  KP

## 2022-08-09 ENCOUNTER — Other Ambulatory Visit: Payer: Self-pay | Admitting: Physician Assistant

## 2022-08-09 ENCOUNTER — Ambulatory Visit
Admission: RE | Admit: 2022-08-09 | Discharge: 2022-08-09 | Disposition: A | Discharge status: Home / Self Care | Payer: Medicaid Other | Attending: Physician Assistant | Admitting: Physician Assistant

## 2022-08-09 ENCOUNTER — Encounter: Payer: Self-pay | Admitting: Physician Assistant

## 2022-08-09 ENCOUNTER — Ambulatory Visit
Admission: RE | Admit: 2022-08-09 | Discharge: 2022-08-09 | Disposition: A | Discharge status: Home / Self Care | Payer: Medicaid Other | Source: Ambulatory Visit | Attending: Physician Assistant | Admitting: Physician Assistant

## 2022-08-09 ENCOUNTER — Ambulatory Visit (INDEPENDENT_AMBULATORY_CARE_PROVIDER_SITE_OTHER): Payer: Medicaid Other | Admitting: Physician Assistant

## 2022-08-09 VITALS — BP 124/82 | HR 72 | Temp 98.2°F | Ht 67.0 in | Wt 193.0 lb

## 2022-08-09 DIAGNOSIS — R21 Rash and other nonspecific skin eruption: Secondary | ICD-10-CM

## 2022-08-09 DIAGNOSIS — D649 Anemia, unspecified: Secondary | ICD-10-CM

## 2022-08-09 DIAGNOSIS — M79602 Pain in left arm: Secondary | ICD-10-CM | POA: Diagnosis present

## 2022-08-09 DIAGNOSIS — J301 Allergic rhinitis due to pollen: Secondary | ICD-10-CM

## 2022-08-09 MED ORDER — MONTELUKAST SODIUM 10 MG PO TABS: 10.0000 mg | ORAL_TABLET | Freq: Every day | ORAL | 3 refills | Status: DC

## 2022-08-09 MED ORDER — MONTELUKAST SODIUM 10 MG PO TABS: 10.0000 mg | ORAL_TABLET | Freq: Every day | ORAL | 1 refills | Status: DC

## 2022-08-09 NOTE — Progress Notes (Addendum)
Date:  08/09/2022   Name:  Natalie Hansen   DOB:  11/26/1970   MRN:  [id number]   Chief Complaint: Arm Pain (Rash )  Rash This is a new problem. Episode onset: X1 week. The problem has been gradually worsening since onset. The affected locations include the left arm. The rash is characterized by burning, redness and pain. She was exposed to nothing. Associated symptoms include fatigue and vomiting. Pertinent negatives include no fever. Treatments tried: tylenol, ibuprofen, aleve. Her past medical history is significant for allergies.   Natalie Hansen presents today with acutely exacerbated left arm/elbow pain which worsened incidentally with the appearance of a rash 1 wk ago on the left forearm and elbow with no known irritant exposures. Says the pain goes all the way up her arm and across her back.  Also endorses fatigue, nausea, vomited today. History of ORIF 07/05/2021 due to left distal humerus intra-articular fracture. No history of septic arthritis.  Most recent XR 05/22/22 normal.   Unfortunately she has been combining ibuprofen and naproxen, also taking Tylenol, says her stomach "cannot take it".  History of intolerance to oral diclofenac and prednisone. Asks for Tylenol-3 today.   Likely unrelated, recent kenalog injection of left subacromial shoulder and left glenohumeral joint on 06/27/22 by my colleague Dr. Joseph Berkshire, MD  Requests refill on Singulair today   Medication list has been reviewed and updated.  Current Meds  Medication Sig   albuterol (PROVENTIL) (2.5 MG/3ML) 0.083% nebulizer solution Inhale into the lungs.   ALPRAZolam (XANAX XR) 2 MG 24 hr tablet Take 2 mg by mouth 3 (three) times daily.   fluticasone (FLONASE) 50 MCG/ACT nasal spray Place 2 sprays into both nostrils daily.   HYDROcodone-acetaminophen (NORCO/VICODIN) 5-325 MG tablet Take 1 tablet by mouth every 8 (eight) hours as needed for severe pain.   montelukast (SINGULAIR) 10 MG tablet Take 1 tablet (10 mg  total) by mouth at bedtime.   RABEprazole (ACIPHEX) 20 MG tablet Take by mouth.   SYMBICORT 160-4.5 MCG/ACT inhaler Inhale 2 puffs into the lungs in the morning and at bedtime. Use for COPD maintenance therapy. Rinse mouth after use.   traZODone (DESYREL) 150 MG tablet Take 150 mg by mouth at bedtime.   VENTOLIN HFA 108 (90 Base) MCG/ACT inhaler Inhale 2 puffs into the lungs every 4 (four) hours as needed for wheezing or shortness of breath.   [DISCONTINUED] cyclobenzaprine (FLEXERIL) 10 MG tablet Take by mouth.     Review of Systems  Constitutional:  Positive for fatigue. Negative for chills and fever.  Gastrointestinal:  Positive for nausea and vomiting.  Musculoskeletal:  Positive for arthralgias.  Skin:  Positive for rash.    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/2011   Gastritis and gastroduodenitis 07/28/2010   Tobacco use disorder 07/28/2010   Depressive disorder 07/28/2010   Hepatitis C virus infection cured after antiviral drug therapy 07/28/2010  Allergies  Allergen Reactions   Prednisone Nausea Only    Severe nausea and vomiting   Tramadol Other (See Comments)    "I about went crazy" "it didn't adjust with my system"   Voltaren [Diclofenac] Nausea And Vomiting    Immunization History  Administered Date(s) Administered   Dtap, Unspecified 01/16/1972, 03/12/1972, 05/08/1972,  05/13/1973   Hepb-cpg 11/08/2017   Influenza, Quadrivalent, Recombinant, Inj, Pf 03/30/2020   Influenza,inj,Quad PF,6+ Mos 12/14/2014   Influenza-Unspecified 01/18/2009   Measles 01/16/1972   Moderna Sars-Covid-2 Vaccination 06/12/2019, 07/11/2019   Mumps 10/17/1976   Pneumococcal Polysaccharide-23 12/14/2014   Polio, Unspecified 01/16/1972, 03/12/1972, 05/08/1972, 05/13/1973   Rubella 01/16/1972   Tdap 07/02/2021   Zoster Recombinat (Shingrix) 07/10/2022    Past Surgical History:  Procedure Laterality Date   ABDOMINAL HYSTERECTOMY  2005   uterus and cervix   SALPINGOOPHORECTOMY Left 2004   Benign ovarian cyst   SALPINGOOPHORECTOMY Right 2014   benign complex R ovarian cyst, irregular bleeding, pelvic pain, adhesions    Social History   Tobacco Use   Smoking status: Every Day    Packs/day: 1.00    Years: 43.00    Additional pack years: 0.00    Total pack years: 43.00    Types: Cigarettes   Smokeless tobacco: Never  Vaping Use   Vaping Use: Never used  Substance Use Topics   Alcohol use: Not Currently   Drug use: Never    Family History  Problem Relation Age of Onset   Hypertension Mother    Hypertension Father    Cancer Maternal Grandmother         07/10/2022    1:51 PM 06/12/2022    3:15 PM  GAD 7 : Generalized Anxiety Score  Nervous, Anxious, on Edge 1 1  Control/stop worrying 1 2  Worry too much - different things 1 2  Trouble relaxing 1 1  Restless 1 1  Easily annoyed or irritable 2 3  Afraid - awful might happen 0 0  Total GAD 7 Score 7 10  Anxiety Difficulty Not difficult at all Somewhat difficult       07/10/2022    1:51 PM 06/12/2022    3:15 PM  Depression screen PHQ 2/9  Decreased Interest 1 3  Down, Depressed, Hopeless 0 0  PHQ - 2 Score 1 3  Altered sleeping 0 3  Tired, decreased energy 1 3  Change in appetite 0 3  Feeling bad or failure about yourself  0 0  Trouble concentrating 0 0  Moving slowly or fidgety/restless 1 3  Suicidal  thoughts 0 0  PHQ-9 Score 3 15  Difficult doing work/chores Not difficult at all Not difficult at all    BP Readings from Last 3 Encounters:  07/10/22 114/78  06/27/22 (!) 150/80  06/12/22 (!) 152/80    Wt Readings from Last 3 Encounters:  08/09/22 193 lb (87.5 kg)  07/10/22 187 lb (84.8 kg)  06/27/22 188 lb (85.3 kg)    Pulse 72   Ht 5\' 7"  (1.702 m)   Wt 193 lb (87.5 kg)   SpO2 93%   BMI 30.23 kg/m   Physical Exam Vitals and nursing note reviewed.  Constitutional:      Appearance: Normal appearance.  Cardiovascular:     Rate and Rhythm: Normal rate.  Pulmonary:     Effort: Pulmonary effort is normal.     Breath sounds: Wheezing present.  Abdominal:     General: There is no distension.  Musculoskeletal:  General: Normal range of motion.     Comments: Left arm with full PROM though she has pain in flexion. Strength 5/5 with pain on resisted extension. TTP out of proportion to exam of essentially the entire LUE but especially along the triceps insertion at the olecranon. No erythema, edema, or warmth at the elbow.   Skin:    General: Skin is warm and dry.     Comments: 3 cm circular collection of many punctate red macules on the dorsal forearm. Nonspecific ecchymoses of elbow.   Neurological:     Mental Status: She is alert and oriented to person, place, and time.     Gait: Gait is intact.  Psychiatric:        Mood and Affect: Mood normal. Affect is tearful.        Recent Labs     Component Value Date/Time   NA 139 07/10/2022 1445   NA 138 05/08/2011 1833   K 4.6 07/10/2022 1445   K 4.6 05/08/2011 1833   CL 101 07/10/2022 1445   CL 102 05/08/2011 1833   CO2 21 07/10/2022 1445   CO2 31 05/08/2011 1833   GLUCOSE 92 07/10/2022 1445   GLUCOSE 97 05/08/2011 1833   BUN 30 (H) 07/10/2022 1445   BUN 18 05/08/2011 1833   CREATININE 0.84 07/10/2022 1445   CREATININE 1.02 05/08/2011 1833   CALCIUM 8.9 07/10/2022 1445   CALCIUM 8.8 05/08/2011 1833   PROT  7.3 07/10/2022 1445   PROT 7.5 05/08/2011 1833   ALBUMIN 4.5 07/10/2022 1445   ALBUMIN 3.4 05/08/2011 1833   AST 19 07/10/2022 1445   AST 31 05/08/2011 1833   ALT 15 07/10/2022 1445   ALT 47 05/08/2011 1833   ALKPHOS 112 07/10/2022 1445   ALKPHOS 108 05/08/2011 1833   BILITOT 0.5 07/10/2022 1445   BILITOT 0.3 05/08/2011 1833   GFRNONAA >60 05/08/2011 1833   GFRAA >60 05/08/2011 1833    Lab Results  Component Value Date   WBC CANCELED 07/10/2022   HGB CANCELED 07/10/2022   HCT CANCELED 07/10/2022   MCV CANCELED 07/10/2022   PLT CANCELED 07/10/2022   No results found for: "HGBA1C" Lab Results  Component Value Date   CHOL 182 07/10/2022   HDL 74 07/10/2022   LDLCALC 95 07/10/2022   TRIG 70 07/10/2022   CHOLHDL 2.5 07/10/2022   Lab Results  Component Value Date   TSH 1.190 07/10/2022     Assessment and Plan:  1. Left arm pain -Obtain stat x-ray today to evaluate for hardware malfunction or obvious joint abnormality.  Nothing on exam suggests septic joint except for pain which is out of proportion to exam. Suspect some type of neuralgia or pain syndrome given her many pain complaints.  -Emphasized importance of monitoring symptoms for changes including fever, edema, worsening pain.  Low threshold for ED evaluation if any of this occurs.  Stressed that septic joint would be an emergency, but I have low suspicion for this at this time. -Patient advised not to combine ibuprofen and naproxen as this could cause GI symptoms including nausea and vomiting.  Advised to stop ibuprofen, use naproxen 220 mg 2 tablets BID and acetaminophen 1000 mg QID. May also use topicals like lidocaine, diclofenac, menthol, etc. Advised I will not be giving controlled pain medications today due to history of substance abuse, current use of Xanax, and also relatively recent prescription for Vicodin given 06/27/22. - DG Elbow Complete Left  2. Rash Check CBC for leukocytosis  or platelet issues as  potential cause of rash.  CBC could not be obtained with her routine labs last visit. - CBC with Differential/Platelet  3. Chronic anemia Check CBC - CBC with Differential/Platelet  4. Non-seasonal allergic rhinitis due to pollen Refill montelukast per patient request - montelukast (SINGULAIR) 10 MG tablet; Take 1 tablet (10 mg total) by mouth at bedtime.  Dispense: 30 tablet; Refill: 1   F/u TBD. ER precautions advised.   Partially dictated using Animal nutritionist. Any errors are unintentional.  Alvester Morin, PA-C, DMSc, Nutritionist [facility] Primary Care and Sports Medicine [facility] [phone]

## 2022-08-10 ENCOUNTER — Telehealth: Payer: Self-pay

## 2022-08-10 LAB — CBC WITH DIFFERENTIAL/PLATELET
Basophils Absolute: 0 10*3/uL (ref 0.0–0.2)
Basos: 1 %
EOS (ABSOLUTE): 0.1 10*3/uL (ref 0.0–0.4)
Eos: 1 %
Hematocrit: 38.5 % (ref 34.0–46.6)
Hemoglobin: 12.7 g/dL (ref 11.1–15.9)
Immature Grans (Abs): 0 10*3/uL (ref 0.0–0.1)
Immature Granulocytes: 1 %
Lymphocytes Absolute: 1.6 10*3/uL (ref 0.7–3.1)
Lymphs: 28 %
MCH: 32.4 pg (ref 26.6–33.0)
MCHC: 33 g/dL (ref 31.5–35.7)
MCV: 98 fL — ABNORMAL HIGH (ref 79–97)
Monocytes Absolute: 0.3 10*3/uL (ref 0.1–0.9)
Monocytes: 6 %
Neutrophils Absolute: 3.7 10*3/uL (ref 1.4–7.0)
Neutrophils: 63 %
Platelets: 253 10*3/uL (ref 150–450)
RBC: 3.92 x10E6/uL (ref 3.77–5.28)
RDW: 12.7 % (ref 11.7–15.4)
WBC: 5.7 10*3/uL (ref 3.4–10.8)

## 2022-08-10 NOTE — Telephone Encounter (Signed)
  Chief Complaint: Vomiting, diarrhea, Arm pain, cough Symptoms: above Frequency: unsure Pertinent Negatives: Patient denies  Disposition: [] ED /[x] Urgent Care (no appt availability in office) / [x] Appointment(In office/virtual)/ []  [facility]/ [] Home Care/ [x] Refused Recommended Disposition /[] [place] [facility]/ []  Follow-up with PCP Additional Notes: Pt called for xray and lab results. Shared xray results. Pt did not seem to be comfortable with POC. Pt also mentioned diarrhea and vomiting, and has a bad cough. Pt states she mentioned all of these issue to provider, but they were not addressed. Offered OV for today, pt refused, Offered OV for tomorrow , also refused. Pt scheduled OV for Monday. Pt states that she is getting serious about her health and she needs a provider that will also get serious with her health care.  Call dropped 3 times, and NT called pt back each time. Pt states that she was told to go to ED if s/s continued. Pt plans to go to ED today for care.  Please advise.   Remo Lipps, PA 08/09/2022  5:19 PM EDT     Attempted call to patient but VM is full. XR of elbow looks good, hardware from prior surgery in place without damage, no fracture or major swelling to suggest any kind of emergent process. No change to plan. Try lidocaine patch, topicals, and naproxen/tylenol as discussed. Proceed to ED if pain is unbearable or if fever develops. Make appt with me for early next week if desired.   Still waiting on blood results, should be available soon

## 2022-08-10 NOTE — Telephone Encounter (Signed)
90 day supply without refills given instead of 90 days with 3 refills at pt's request.   Requested Prescriptions  Pending Prescriptions Disp Refills   montelukast (SINGULAIR) 10 MG tablet [Pharmacy Med Name: MONTELUKAST 10MG  TABLETS] 90 tablet 0    Sig: TAKE 1 TABLET(10 MG) BY MOUTH AT BEDTIME     Pulmonology:  Leukotriene Inhibitors Passed - 08/09/2022  4:55 PM      Passed - Valid encounter within last 12 months    Recent Outpatient Visits           Yesterday Left arm pain   [place] [facility], Melton Alar, PA   1 month ago Annual physical exam   [facility] at [facility], Melton Alar, PA   1 month ago Osteoarthritis of left glenohumeral joint   [place] [facility] at [facility], Ocie Bob, MD   1 month ago Chronic obstructive pulmonary disease, unspecified COPD type [facility])   [place] [facility] at [facility], Melton Alar, [place]   2 months ago Rotator cuff dysfunction, left   [facility] at [facility], Ocie Bob, MD       Future Appointments             In 4 days Mordecai Maes, Melton Alar, PA [facility] at [facility], [facility]

## 2022-08-14 ENCOUNTER — Encounter: Payer: Self-pay | Admitting: Physician Assistant

## 2022-08-14 ENCOUNTER — Ambulatory Visit (INDEPENDENT_AMBULATORY_CARE_PROVIDER_SITE_OTHER): Payer: Medicaid Other | Admitting: Physician Assistant

## 2022-08-14 VITALS — BP 120/78 | HR 98 | Temp 99.0°F | Ht 67.0 in | Wt 183.0 lb

## 2022-08-14 DIAGNOSIS — R112 Nausea with vomiting, unspecified: Secondary | ICD-10-CM | POA: Diagnosis not present

## 2022-08-14 DIAGNOSIS — M79602 Pain in left arm: Secondary | ICD-10-CM | POA: Diagnosis not present

## 2022-08-14 DIAGNOSIS — R509 Fever, unspecified: Secondary | ICD-10-CM | POA: Diagnosis not present

## 2022-08-14 NOTE — Progress Notes (Signed)
Date:  08/14/2022   Name:  Natalie Hansen   DOB:  03/29/1970   MRN:  [id number]   Chief Complaint: Arm Pain and COPD  HPI Natalie Hansen returns for short interval follow-up on worsening left arm pain last evaluated by me 5 days ago, with persistent fever, nausea, vomiting.  ORIF just over a year ago from fracture.  Advised her at her last visit that if symptoms persist or worsen she should seek ED evaluation; unfortunately she did not take my advice and returns for assessment today.  She states that the arm is making her miserable, unable to sleep, cannot even brush her hair due to stiffness, swelling, and pain.  Asks for pain medication again.  The rash is not spreading.  States "something is clearly wrong."  (PRIOR NOTE 08/09/22): Natalie Hansen presents today with acutely exacerbated left arm/elbow pain which worsened incidentally with the appearance of a rash 1 wk ago on the left forearm and elbow with no known irritant exposures. Says the pain goes all the way up her arm and across her back.  Also endorses fatigue, nausea, vomited today. History of ORIF 07/05/2021 due to left distal humerus intra-articular fracture. No history of septic arthritis.  Most recent XR 05/22/22 normal.     Medication list has been reviewed and updated.  Current Meds  Medication Sig   albuterol (PROVENTIL) (2.5 MG/3ML) 0.083% nebulizer solution Inhale into the lungs.   ALPRAZolam (XANAX XR) 2 MG 24 hr tablet Take 2 mg by mouth 3 (three) times daily.   fluticasone (FLONASE) 50 MCG/ACT nasal spray Place 2 sprays into both nostrils daily.   montelukast (SINGULAIR) 10 MG tablet TAKE 1 TABLET(10 MG) BY MOUTH AT BEDTIME   RABEprazole (ACIPHEX) 20 MG tablet Take by mouth.   SYMBICORT 160-4.5 MCG/ACT inhaler Inhale 2 puffs into the lungs in the morning and at bedtime. Use for COPD maintenance therapy. Rinse mouth after use.   traZODone (DESYREL) 150 MG tablet Take 150 mg by mouth at bedtime.   VENTOLIN HFA 108 (90 Base) MCG/ACT  inhaler Inhale 2 puffs into the lungs every 4 (four) hours as needed for wheezing or shortness of breath.     Review of Systems  Constitutional:  Positive for fatigue and fever. Negative for chills.  Gastrointestinal:  Positive for nausea and vomiting.  Musculoskeletal:  Positive for arthralgias.  Skin:  Positive for rash.  Psychiatric/Behavioral:  Positive for sleep disturbance.     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/2011   Gastritis and gastroduodenitis 07/28/2010   Tobacco use disorder 07/28/2010   Depressive disorder 07/28/2010   Hepatitis C virus infection cured after antiviral drug therapy 07/28/2010    Allergies  Allergen Reactions   Prednisone Nausea Only    Severe nausea and vomiting   Tramadol Other (See Comments)    "I about went crazy" "it didn't adjust with my system"   Voltaren [Diclofenac] Nausea And Vomiting  Immunization History  Administered Date(s) Administered   Dtap, Unspecified 01/16/1972, 03/12/1972, 05/08/1972, 05/13/1973   Hepb-cpg 11/08/2017   Influenza, Quadrivalent, Recombinant, Inj, Pf 03/30/2020   Influenza,inj,Quad PF,6+ Mos 12/14/2014   Influenza-Unspecified 01/18/2009   Measles 01/16/1972   Moderna Sars-Covid-2 Vaccination 06/12/2019, 07/11/2019   Mumps 10/17/1976   Pneumococcal Polysaccharide-23 12/14/2014   Polio, Unspecified 01/16/1972, 03/12/1972,  05/08/1972, 05/13/1973   Rubella 01/16/1972   Tdap 07/02/2021   Zoster Recombinat (Shingrix) 07/10/2022    Past Surgical History:  Procedure Laterality Date   ABDOMINAL HYSTERECTOMY  2005   uterus and cervix   SALPINGOOPHORECTOMY Left 2004   Benign ovarian cyst   SALPINGOOPHORECTOMY Right 2014   benign complex R ovarian cyst, irregular bleeding, pelvic pain, adhesions    Social History   Tobacco Use   Smoking status: Every Day    Packs/day: 1.00    Years: 43.00    Additional pack years: 0.00    Total pack years: 43.00    Types: Cigarettes   Smokeless tobacco: Never  Vaping Use   Vaping Use: Never used  Substance Use Topics   Alcohol use: Not Currently   Drug use: Never    Family History  Problem Relation Age of Onset   Hypertension Mother    Hypertension Father    Cancer Maternal Grandmother         08/14/2022    4:00 PM 08/09/2022    3:55 PM 07/10/2022    1:51 PM 06/12/2022    3:15 PM  GAD 7 : Generalized Anxiety Score  Nervous, Anxious, on Edge 2 0 1 1  Control/stop worrying 1 1 1 2   Worry too much - different things 1 2 1 2   Trouble relaxing 0 1 1 1   Restless 0 1 1 1   Easily annoyed or irritable 2 1 2 3   Afraid - awful might happen 1 0 0 0  Total GAD 7 Score 7 6 7 10   Anxiety Difficulty Not difficult at all Somewhat difficult Not difficult at all Somewhat difficult       08/14/2022    3:59 PM 08/09/2022    3:54 PM 07/10/2022    1:51 PM  Depression screen PHQ 2/9  Decreased Interest 0 1 1  Down, Depressed, Hopeless 2 0 0  PHQ - 2 Score 2 1 1   Altered sleeping 2 2 0  Tired, decreased energy 1 1 1   Change in appetite 0 0 0  Feeling bad or failure about yourself  0 0 0  Trouble concentrating 0 0 0  Moving slowly or fidgety/restless 0 1 1  Suicidal thoughts 0 0 0  PHQ-9 Score 5 5 3   Difficult doing work/chores Somewhat difficult Somewhat difficult Not difficult at all    BP Readings from Last 3 Encounters:  08/14/22 120/78  08/09/22 124/82   07/10/22 114/78    Wt Readings from Last 3 Encounters:  08/14/22 183 lb (83 kg)  08/09/22 193 lb (87.5 kg)  07/10/22 187 lb (84.8 kg)    BP 120/78   Pulse 98   Temp 99 F (37.2 C) (Oral)   Ht 5\' 7"  (1.702 m)   Wt 183 lb (83 kg)   SpO2 98%   BMI 28.66 kg/m   Physical Exam Vitals and nursing note reviewed.  Constitutional:      Appearance: Normal appearance.  Cardiovascular:     Rate and Rhythm: Normal rate.  Pulmonary:     Effort: Pulmonary effort is normal.     Breath sounds:  Wheezing present.  Abdominal:     General: There is no distension.  Musculoskeletal:        General: Normal range of motion.     Comments: TTP out of proportion to exam of essentially the entire LUE but especially along the triceps insertion at the olecranon. Left arm ROM limited at the elbow due to pain. She has erythema, edema, and warmth at the elbow.  Left arm measures 30 cm just proximal to the elbow, compared to 27 cm on the right.   Skin:    General: Skin is warm and dry.     Comments: 3 cm circular collection of many punctate red macules on the dorsal forearm. Nonspecific ecchymoses of elbow.   Neurological:     Mental Status: She is alert and oriented to person, place, and time.     Gait: Gait is intact.  Psychiatric:        Mood and Affect: Mood normal. Affect is tearful.     Recent Labs     Component Value Date/Time   NA 139 07/10/2022 1445   NA 138 05/08/2011 1833   K 4.6 07/10/2022 1445   K 4.6 05/08/2011 1833   CL 101 07/10/2022 1445   CL 102 05/08/2011 1833   CO2 21 07/10/2022 1445   CO2 31 05/08/2011 1833   GLUCOSE 92 07/10/2022 1445   GLUCOSE 97 05/08/2011 1833   BUN 30 (H) 07/10/2022 1445   BUN 18 05/08/2011 1833   CREATININE 0.84 07/10/2022 1445   CREATININE 1.02 05/08/2011 1833   CALCIUM 8.9 07/10/2022 1445   CALCIUM 8.8 05/08/2011 1833   PROT 7.3 07/10/2022 1445   PROT 7.5 05/08/2011 1833   ALBUMIN 4.5 07/10/2022 1445   ALBUMIN 3.4 05/08/2011 1833   AST 19  07/10/2022 1445   AST 31 05/08/2011 1833   ALT 15 07/10/2022 1445   ALT 47 05/08/2011 1833   ALKPHOS 112 07/10/2022 1445   ALKPHOS 108 05/08/2011 1833   BILITOT 0.5 07/10/2022 1445   BILITOT 0.3 05/08/2011 1833   GFRNONAA >60 05/08/2011 1833   GFRAA >60 05/08/2011 1833    Lab Results  Component Value Date   WBC 5.7 08/09/2022   HGB 12.7 08/09/2022   HCT 38.5 08/09/2022   MCV 98 (H) 08/09/2022   PLT 253 08/09/2022   No results found for: "HGBA1C" Lab Results  Component Value Date   CHOL 182 07/10/2022   HDL 74 07/10/2022   LDLCALC 95 07/10/2022   TRIG 70 07/10/2022   CHOLHDL 2.5 07/10/2022   Lab Results  Component Value Date   TSH 1.190 07/10/2022     Assessment and Plan:  1. Left arm pain Suspicion for septic joint versus osteomyelitis until proven otherwise.  At our last evaluation, she had no erythema/edema/warmth but all 3 are present today.    Discussed this is outside the realm of management in primary care as we cannot efficiently trend stat labs or use IV antibiotics.  Stressed with patient this is a medical emergency and warrants immediate ED evaluation, but unfortunately she refused.  She said she will consider going later tonight or tomorrow.  Again I denied opioid for pain management. Offered gabapentin but declined - too sedating.   Considered oral antibiotic but I fear this will not be effective if she truly did have septic joint, and could potentially delay appropriate care if she waits for improvement.   2. Fever, unspecified fever cause Plan as above  3. Nausea and vomiting, unspecified  vomiting type Plan as above  Follow-up as needed/TBD  Partially dictated using Animal nutritionist. Any errors are unintentional.  Alvester Morin, PA-C, DMSc, Nutritionist [facility] [phone]

## 2022-08-15 ENCOUNTER — Ambulatory Visit
Admit: 2022-08-15 | Discharge: 2022-08-15 | Disposition: A | Discharge status: Home / Self Care | Payer: PRIVATE HEALTH INSURANCE | Attending: Family

## 2022-08-15 ENCOUNTER — Emergency Department
Admit: 2022-08-15 | Discharge: 2022-08-15 | Disposition: A | Discharge status: Home / Self Care | Payer: PRIVATE HEALTH INSURANCE | Attending: Family

## 2022-08-15 ENCOUNTER — Telehealth: Payer: Self-pay | Admitting: Physician Assistant

## 2022-08-15 DIAGNOSIS — M25522 Pain in left elbow: Principal | ICD-10-CM

## 2022-08-15 MED ORDER — HYDROCODONE 5 MG-ACETAMINOPHEN 325 MG TABLET
ORAL_TABLET | Freq: Four times a day (QID) | ORAL | 0 refills | 1 days | Status: CP | PRN
Start: 2022-08-15 — End: ?

## 2022-08-15 NOTE — Telephone Encounter (Signed)
Copied from CRM [id number]. Topic: General - Other >> Aug 15, 2022 12:46 PM Ja-Kwan M wrote: Reason for CRM: Pt wants to make provider aware that she will be going to the ED today as instructed yesterday

## 2022-08-15 NOTE — Telephone Encounter (Signed)
fyi

## 2022-08-16 ENCOUNTER — Ambulatory Visit
Admit: 2022-08-16 | Discharge: 2022-08-16 | Disposition: A | Discharge status: Home / Self Care | Payer: PRIVATE HEALTH INSURANCE | Attending: Emergency Medicine

## 2022-08-16 ENCOUNTER — Institutional Professional Consult (permissible substitution): Payer: Medicaid Other | Admitting: Student in an Organized Health Care Education/Training Program

## 2022-08-16 ENCOUNTER — Inpatient Hospital Stay: Admission: RE | Admit: 2022-08-16 | Payer: Medicaid Other | Source: Ambulatory Visit

## 2022-08-16 ENCOUNTER — Other Ambulatory Visit: Payer: Self-pay | Admitting: Physician Assistant

## 2022-08-16 ENCOUNTER — Encounter: Payer: Self-pay | Admitting: Student in an Organized Health Care Education/Training Program

## 2022-08-16 ENCOUNTER — Telehealth: Payer: Self-pay

## 2022-08-16 DIAGNOSIS — M25522 Pain in left elbow: Principal | ICD-10-CM

## 2022-08-16 DIAGNOSIS — G8929 Other chronic pain: Principal | ICD-10-CM

## 2022-08-16 DIAGNOSIS — M79602 Pain in left arm: Secondary | ICD-10-CM

## 2022-08-16 MED ORDER — GABAPENTIN 300 MG PO CAPS
300.0000 mg | ORAL_CAPSULE | Freq: Three times a day (TID) | ORAL | 1 refills | Status: DC
Start: 2022-08-16 — End: 2022-10-18

## 2022-08-16 MED ORDER — DULOXETINE HCL 30 MG PO CPEP
30.0000 mg | ORAL_CAPSULE | Freq: Every day | ORAL | 1 refills | Status: DC
Start: 2022-08-16 — End: 2022-08-17

## 2022-08-16 NOTE — Transitions of Care (Post Inpatient/ED Visit) (Signed)
   08/16/2022  Name: ANGI RAMEL MRN: [id number] DOB: 10-13-1970  Today's TOC FU Call Status: Today's TOC FU Call Status:: Unsuccessul Call (1st Attempt) Unsuccessful Call (1st Attempt) Date: 08/16/22  Attempted to reach the patient regarding the most recent Inpatient/ED visit.  Follow Up Plan: Additional outreach attempts will be made to reach the patient to complete the Transitions of Care (Post Inpatient/ED visit) call.   Signature  TB,CMA

## 2022-08-16 NOTE — Progress Notes (Signed)
Reviewed Maury's ED note. No leukocytosis or fever, no need for arthrocentesis, she was sent home with reassurance.  Spoke with Jasmine December on the phone, let her know I consulted with Dr. Ashley Royalty this morning. Differential includes CRPS. Discussed starting duloxetine and/or gabapentin.  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  Despite this, she would be willing to try again. Sent both medications to her pharmacy.  She is to begin gabapentin first, starting with only a single dose tonight or tomorrow and titrating the dose over the next few days up to 3 times daily dosing. After several days on gabapentin, she may start duloxetine.  This way she can avoid confusion of side effects, if any.  Advised that she will have to stop trazodone in order to try duloxetine to avoid increased risk of serotonin syndrome.  Patient verbalizes understanding and intends to proceed with plan. Advised she give [place] an update early next week and arrange follow-up appointment in the next 1 to 2 weeks with myself or Dr. Ashley Royalty.

## 2022-08-17 ENCOUNTER — Other Ambulatory Visit: Payer: Self-pay | Admitting: Physician Assistant

## 2022-08-17 ENCOUNTER — Telehealth: Payer: Self-pay | Admitting: Physician Assistant

## 2022-08-17 ENCOUNTER — Telehealth: Payer: Self-pay

## 2022-08-17 DIAGNOSIS — G8929 Other chronic pain: Principal | ICD-10-CM

## 2022-08-17 DIAGNOSIS — M25522 Pain in left elbow: Principal | ICD-10-CM

## 2022-08-17 MED ORDER — TRAZODONE HCL 150 MG PO TABS: 150.0000 mg | ORAL_TABLET | Freq: Every day | ORAL | Status: DC

## 2022-08-17 NOTE — Progress Notes (Signed)
Patient changed her mind regarding duloxetine.  Addendum to remove duloxetine, reactivate trazodone.

## 2022-08-17 NOTE — Transitions of Care (Post Inpatient/ED Visit) (Signed)
   08/17/2022  Name: Natalie Hansen MRN: [id number] DOB: September 30, 1970  Today's TOC FU Call Status: Today's TOC FU Call Status:: Successful TOC FU Call Competed TOC FU Call Complete Date: 08/17/22  Transition Care Management Follow-up Telephone Call Date of Discharge: 08/16/22 Discharge Facility: Other ([facility]) Name of Other ([facility]) Discharge Facility: [facility] Type of Discharge: Emergency Department Reason for ED Visit: Other: (left elbow pain) How have you been since you were released from the hospital?: Same Any questions or concerns?: No  Items Reviewed: Did you receive and understand the discharge instructions provided?: Yes Medications obtained,verified, and reconciled?: Yes (Medications Reviewed) Any new allergies since your discharge?: No Dietary orders reviewed?: Yes Do you have support at home?: No  Medications Reviewed Today: Medications Reviewed Today     Reviewed by Karena Addison, LPN (Licensed Practical Nurse) on 08/17/22 at 1225  Med List Status: <None>   Medication Order Taking? Sig Documenting Provider Last Dose Status Informant  albuterol (PROVENTIL) (2.5 MG/3ML) 0.083% nebulizer solution [id number] No Inhale into the lungs. [provider] Taking Active   ALPRAZolam (XANAX XR) 2 MG 24 hr tablet [id number] No Take 2 mg by mouth 3 (three) times daily. [provider] Taking Active Self  DULoxetine (CYMBALTA) 30 MG capsule [id number]  Take 1 capsule (30 mg total) by mouth daily. May take several weeks to notice any benefit. Remo Lipps, PA  Active   fluticasone [facility]) 50 MCG/ACT nasal spray [id number] No Place 2 sprays into both nostrils daily. Remo Lipps, PA Taking Active   gabapentin (NEURONTIN) 300 MG capsule [id number]  Take 1 capsule (300 mg total) by mouth 3 (three) times daily. May cause increased drowsiness with Xanax. Day 1 - Take once. Day 2 - Take twice. Day 3 and after - Take up to three times daily. Remo Lipps, PA  Active   montelukast (SINGULAIR) 10 MG tablet [id number] No TAKE 1 TABLET(10 MG) BY MOUTH AT BEDTIME Remo Lipps, PA Taking Active   RABEprazole (ACIPHEX) 20 MG tablet [id number] No Take by mouth. [provider] Taking Active   SYMBICORT 160-4.5 MCG/ACT inhaler [id number] No Inhale 2 puffs into the lungs in the morning and at bedtime. Use for COPD maintenance therapy. Rinse mouth after use. Remo Lipps, PA Taking Active   VENTOLIN HFA 108 [phone] Base) MCG/ACT inhaler [id number] No Inhale 2 puffs into the lungs every 4 (four) hours as needed for wheezing or shortness of breath. Remo Lipps, PA Taking Active             Home Care and Equipment/Supplies: Were Home Health Services Ordered?: NA Any new equipment or medical supplies ordered?: NA  Functional Questionnaire: Do you need assistance with bathing/showering or dressing?: No Do you need assistance with meal preparation?: No Do you need assistance with eating?: No Do you have difficulty maintaining continence: No Do you need assistance with getting out of bed/getting out of a chair/moving?: No Do you have difficulty managing or taking your medications?: No  Follow up appointments reviewed: PCP Follow-up appointment confirmed?: No (declined) MD Provider Line Number:[phone] Given: No Specialist Hospital Follow-up appointment confirmed?: NA Do you need transportation to your follow-up appointment?: No Do you understand care options if your condition(s) worsen?: Yes-patient verbalized understanding    SIGNATURE Karena Addison, LPN [facility] Nurse Health Advisor Direct Dial [phone]

## 2022-08-17 NOTE — Telephone Encounter (Signed)
Please review.  KP

## 2022-08-17 NOTE — Telephone Encounter (Signed)
Pt stated the trazodone has been working well. Pt stated she will not go off trazodone or xanax. Pt stated she will not take cymbalata. She will take gabapentin.  KP

## 2022-08-17 NOTE — Telephone Encounter (Signed)
The patient called in stating she does not want to take an anti depressant. She was prescribed DULoxetine (CYMBALTA) 30 MG capsule and she states she does not want this as she had issues in the past with this type of medicine. She states she has no problem with gapapentin and her psychiatrist prescribed her Trazadone in the past and she just really wants to continue taking this medicine along with her Alprazolam that she continues to take. Please assist patient further.

## 2022-08-18 ENCOUNTER — Telehealth: Payer: Self-pay

## 2022-08-18 NOTE — Telephone Encounter (Signed)
Error

## 2022-09-10 ENCOUNTER — Ambulatory Visit
Admission: EM | Admit: 2022-09-10 | Discharge: 2022-09-10 | Disposition: A | Discharge status: Home / Self Care | Payer: Medicaid Other | Attending: Family Medicine | Admitting: Family Medicine

## 2022-09-10 ENCOUNTER — Encounter: Payer: Self-pay | Admitting: Emergency Medicine

## 2022-09-10 DIAGNOSIS — S51812A Laceration without foreign body of left forearm, initial encounter: Secondary | ICD-10-CM

## 2022-09-10 DIAGNOSIS — M79602 Pain in left arm: Secondary | ICD-10-CM | POA: Diagnosis not present

## 2022-09-10 MED ORDER — AMOXICILLIN-POT CLAVULANATE 875-125 MG PO TABS: 1.0000 | ORAL_TABLET | Freq: Two times a day (BID) | ORAL | 0 refills | Status: DC

## 2022-09-10 MED ORDER — HYDROCODONE-ACETAMINOPHEN 5-325 MG PO TABS: 2.0000 | ORAL_TABLET | Freq: Four times a day (QID) | ORAL | 0 refills | Status: DC | PRN

## 2022-09-10 NOTE — Discharge Instructions (Addendum)
Your tetanus was last given in May 2023 therefore he was not given today.  Stop at the pharmacy to pick up your antibiotics and pain medication.  Keep your appointment with your primary care doctor for wound re-check as scheduled.   Go to the emergency department if your redness or pain suddenly worsen or you develop a fever.

## 2022-09-10 NOTE — ED Triage Notes (Signed)
Pt state her dog scratched her left forearm about 4 days ago. Pt is concerned for infection as she has metal plates in her arm.

## 2022-09-10 NOTE — ED Provider Notes (Addendum)
[facility]    CSN: [id number] Arrival date & time: 09/10/22  1243      History   Chief Complaint Chief Complaint  Patient presents with   dog scratch    Left forearm    HPI Natalie Hansen is a 52 y.o. female.   HPI  Natalie Hansen presents for left forearm injury that occurred 4 days ago.  States that her dog scratched her.  The area has been red and swollen.  She has metal plates in her arm and she does not want it to get really infected.  She cleaned it and put Neosporin on it prior to arrival.    Past Medical History:  Diagnosis Date   Allergies    Anxiety    Chronic bronchitis (HCC)    COPD (chronic obstructive pulmonary disease) (HCC)    GERD (gastroesophageal reflux disease)    Insomnia    PTSD (post-traumatic stress disorder)     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/2011   Suicide attempt by self-inflicted suffocation (HCC) 05/26/2011   Gastritis and gastroduodenitis 07/28/2010   Tobacco use disorder 07/28/2010   Depressive disorder 07/28/2010   Hepatitis C virus infection cured after antiviral drug therapy 07/28/2010    Past Surgical History:  Procedure Laterality Date   ABDOMINAL HYSTERECTOMY  2005   uterus and cervix   SALPINGOOPHORECTOMY Left 2004   Benign ovarian cyst   SALPINGOOPHORECTOMY Right 2014   benign complex R ovarian cyst, irregular bleeding,  pelvic pain, adhesions    OB History   No obstetric history on file.      Home Medications    Prior to Admission medications   Medication Sig Start Date End Date Taking? Authorizing Provider  albuterol (PROVENTIL) (2.5 MG/3ML) 0.083% nebulizer solution Inhale into the lungs. 09/23/12  Yes [provider]  ALPRAZolam (XANAX XR) 2 MG 24 hr tablet Take 2 mg by mouth 3 (three) times daily.   Yes [provider]  amoxicillin-clavulanate (AUGMENTIN) 875-125 MG tablet Take 1 tablet by mouth every 12 (twelve) hours. 09/10/22  Yes Swetha Rayle, DO  fluticasone (FLONASE) 50 MCG/ACT nasal spray Place 2 sprays into both nostrils daily. 06/12/22  Yes Remo Lipps, PA  gabapentin (NEURONTIN) 300 MG capsule Take 1 capsule (300 mg total) by mouth 3 (three) times daily. May cause increased drowsiness with Xanax. Day 1 - Take once. Day 2 - Take twice. Day 3 and after - Take up to three times daily. 08/16/22  Yes Remo Lipps, PA  HYDROcodone-acetaminophen (NORCO/VICODIN) 5-325 MG tablet Take 2 tablets by mouth every 6 (six) hours as needed. 09/10/22  Yes Sou Nohr, DO  montelukast (SINGULAIR) 10 MG tablet TAKE 1 TABLET(10 MG) BY MOUTH AT BEDTIME 08/10/22  Yes Remo Lipps, PA  SYMBICORT 160-4.5 MCG/ACT inhaler Inhale 2 puffs into the lungs in the morning and at bedtime. Use for COPD maintenance therapy. Rinse mouth after use. 06/21/22  Yes Remo Lipps, PA  traZODone (DESYREL) 150 MG tablet Take 1 tablet (150 mg total) by mouth at bedtime. 08/17/22  Yes Waddell, Daniel P, PA  VENTOLIN HFA 108 (90 Base) MCG/ACT inhaler Inhale 2 puffs into the lungs every 4 (four) hours as needed for wheezing or shortness of breath. 06/12/22  Yes Remo Lipps, PA  RABEprazole (ACIPHEX) 20 MG tablet Take by mouth. 09/23/12   [provider]    Family History Family History  Problem Relation Age of Onset   Hypertension Mother    Hypertension Father    Cancer Maternal  Grandmother     Social History Social History   Tobacco Use   Smoking status: Every Day    Current packs/day: 1.00    Average packs/day: 1 pack/day for 43.0 years (43.0 ttl pk-yrs)    Types: Cigarettes   Smokeless tobacco: Never  Vaping Use   Vaping status: Never Used  Substance Use Topics   Alcohol use: Not Currently   Drug use: Never     Allergies   Prednisone, Tramadol, and Voltaren [diclofenac]   Review of Systems Review of Systems :negative unless otherwise stated in HPI.      Physical Exam Triage Vital Signs ED Triage Vitals  Encounter Vitals Group     BP 09/10/22 1310 129/86     Systolic BP Percentile --      Diastolic BP Percentile --      Pulse Rate 09/10/22 1310 76     Resp 09/10/22 1310 16     Temp 09/10/22 1310 99.1 F (37.3 C)     Temp Source 09/10/22 1310 Oral     SpO2 09/10/22 1310 97 %     Weight 09/10/22 1308 182 lb 15.7 oz (83 kg)     Height 09/10/22 1308 5\' 7"  (1.702 m)     Head Circumference --      Peak Flow --      Pain Score 09/10/22 1308 10     Pain Loc --      Pain Education --      Exclude from Growth Chart --    No data found.  Updated Vital Signs BP 129/86 (BP Location: Right Arm)   Pulse 76   Temp 99.1 F (37.3 C) (Oral)   Resp 16   Ht 5\' 7"  (1.702 m)   Wt 83 kg   SpO2 97%   BMI 28.66 kg/m   Visual Acuity Right Eye Distance:   Left Eye Distance:   Bilateral Distance:    Right Eye Near:   Left Eye Near:    Bilateral Near:     Physical Exam  GEN: alert, chronically ill-appearing female, in no acute distress  CV: regular rate, strong radial pulse RESP: no increased work of breathing MSK: Tender to palpation at left forearm at the medial epicondyle, tenderness in the musculature around the scratch, no appreciable edema NEURO: alert, oriented at her baseline SKIN: warm and dry; see photo below, most distal section with purulent drainage with a 0.5 cm wide opening, wound itself is erythematous and  hyperpigmented     UC Treatments / Results  Labs (all labs ordered are listed, but only abnormal results are displayed) Labs Reviewed - No data to display  EKG   Radiology No results found.  Procedures Procedures (including critical care time)  Medications Ordered in UC Medications - No data to display  Initial Impression / Assessment and Plan / UC Course  I have reviewed  the triage vital signs and the nursing notes.  Pertinent labs & imaging results that were available during my care of the patient were reviewed by me and considered in my medical decision making (see chart for details).      Patient is a 52 y.o. female who presents for dog scratch 4 days ago.  Wounds cleansed and covered with sterile dressing. Topical antibiotic applied.  Open lacerations to heal by secondary intention.   This is  known animal whose vaccines are up-to-date. Tetanus is UTD 07/02/2021. Over-the-counter analgesics as needed. Treat with Augmentin.  Norco given for pain.       Final Clinical Impressions(s) / UC Diagnoses   Final diagnoses:  Laceration of left forearm, initial encounter  Left arm pain     Discharge Instructions      Your tetanus was last given in May 2023 therefore he was not given today.  Stop at the pharmacy to pick up your antibiotics and pain medication.  Keep your appointment with your primary care doctor for wound re-check as scheduled.   Go to the emergency department if your redness or pain suddenly worsen or you develop a fever.      ED Prescriptions     Medication Sig Dispense Auth. Provider   HYDROcodone-acetaminophen (NORCO/VICODIN) 5-325 MG tablet Take 2 tablets by mouth every 6 (six) hours as needed. 6 tablet Torian Quintero, DO   amoxicillin-clavulanate (AUGMENTIN) 875-125 MG tablet Take 1 tablet by mouth every 12 (twelve) hours. 14 tablet Merlen Gurry, DO      I have reviewed the PDMP during this encounter.              Katha Cabal, DO 09/13/22 1245    Katha Cabal, DO 09/13/22 1246

## 2022-09-13 ENCOUNTER — Ambulatory Visit (INDEPENDENT_AMBULATORY_CARE_PROVIDER_SITE_OTHER): Payer: Medicaid Other | Admitting: Physician Assistant

## 2022-09-13 ENCOUNTER — Encounter: Payer: Self-pay | Admitting: Physician Assistant

## 2022-09-13 VITALS — BP 124/86 | HR 96 | Ht 67.0 in | Wt 192.0 lb

## 2022-09-13 DIAGNOSIS — R233 Spontaneous ecchymoses: Secondary | ICD-10-CM

## 2022-09-13 DIAGNOSIS — S51812D Laceration without foreign body of left forearm, subsequent encounter: Secondary | ICD-10-CM

## 2022-09-13 NOTE — Progress Notes (Signed)
Date:  09/13/2022   Name:  Natalie Hansen   DOB:  [birth year]   MRN:  [id number]   Chief Complaint: Rash (Rash is worse darker spots on right arm ), Medication Problem (Pt stated partner has hidden medication because he states it makes her crazy ), and Laceration (Left arm, from dog's nails, seen in urgent care 09/10/22)  HPI Natalie Hansen presents today for follow-up on laceration of the left forearm sustained 09/06/2022 evaluated by ED 09/10/2022 not requiring stitches.  She states she was feeding her dog chicken but the dog got excited, jumped up on her, and scratched open the forearm with its nails.  Tdap given last year.  Patient was given Augmentin, with which she reports good compliance, almost out of Norco.  States the forearm seems to be healing.  She likes the relief that she gets from her Ace wrap with regard to chronic left forearm pain.  Unrelated, she has many circular bruises of the right upper arm with unknown origin, denies trauma to the area. She does not use aspirin or blood thinner. History of Hep C but cured.   She also states that while gabapentin was helping with the chronic pain, her partner has hidden the medication from her on account of mood change.  Importantly, patient feels that the medication does not have adverse effect on her mood, and if anything improves her mood.   Medication list has been reviewed and updated.  Current Meds  Medication Sig   albuterol (PROVENTIL) (2.5 MG/3ML) 0.083% nebulizer solution Inhale into the lungs.   ALPRAZolam (XANAX XR) 2 MG 24 hr tablet Take 2 mg by mouth 3 (three) times daily.   amoxicillin-clavulanate (AUGMENTIN) 875-125 MG tablet Take 1 tablet by mouth every 12 (twelve) hours.   fluticasone (FLONASE) 50 MCG/ACT nasal spray Place 2 sprays into both nostrils daily.   gabapentin (NEURONTIN) 300 MG capsule Take 1 capsule (300 mg total) by mouth 3 (three) times daily. May cause increased drowsiness with Xanax. Day 1 - Take once. Day 2 -  Take twice. Day 3 and after - Take up to three times daily.   HYDROcodone-acetaminophen (NORCO/VICODIN) 5-325 MG tablet Take 2 tablets by mouth every 6 (six) hours as needed.   montelukast (SINGULAIR) 10 MG tablet TAKE 1 TABLET(10 MG) BY MOUTH AT BEDTIME   RABEprazole (ACIPHEX) 20 MG tablet Take by mouth.   SYMBICORT 160-4.5 MCG/ACT inhaler Inhale 2 puffs into the lungs in the morning and at bedtime. Use for COPD maintenance therapy. Rinse mouth after use.   traZODone (DESYREL) 150 MG tablet Take 1 tablet (150 mg total) by mouth at bedtime.   VENTOLIN HFA 108 (90 Base) MCG/ACT inhaler Inhale 2 puffs into the lungs every 4 (four) hours as needed for wheezing or shortness of breath.     Review of Systems  Skin:  Positive for color change (bruising) and wound.    Patient Active Problem List   Diagnosis Date Noted   History of intravenous drug use 07/10/2022   Osteoarthritis of left glenohumeral joint 06/27/2022   Rotator cuff dysfunction, left 06/18/2022   Left cervical radiculopathy 06/18/2022   History of hepatitis 06/12/2022   Chronic anemia 06/12/2022   Allergic rhinitis 06/12/2022   Hypertension 06/12/2022   PTSD (post-traumatic stress disorder) 07/02/2021   Anxiety 07/02/2021   Cocaine abuse in remission (HCC) 07/02/2021   ETOH abuse 07/02/2021   At risk for long QT syndrome 07/19/2017   Chronic airway obstruction (HCC) 05/26/2011  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/2011   Suicide attempt by self-inflicted suffocation (HCC) 05/26/2011   Gastritis and gastroduodenitis 07/28/2010   Tobacco use disorder 07/28/2010   Depressive disorder 07/28/2010   Hepatitis C virus infection cured after antiviral drug therapy 07/28/2010    Allergies  Allergen Reactions   Prednisone Nausea Only    Severe nausea and vomiting   Tramadol Other (See Comments)    "I about went crazy" "it didn't adjust with my system"   Voltaren [Diclofenac] Nausea And Vomiting    Immunization History   Administered Date(s) Administered   Dtap, Unspecified 01/16/1972, 03/12/1972, 05/08/1972, 05/13/1973   Hepb-cpg 11/08/2017   Influenza, Quadrivalent, Recombinant, Inj, Pf 03/30/2020   Influenza,inj,Quad PF,6+ Mos 12/14/2014   Influenza-Unspecified 01/18/2009   Measles 01/16/1972   Moderna Sars-Covid-2 Vaccination 06/12/2019, 07/11/2019   Mumps 10/17/1976   Pneumococcal Polysaccharide-23 12/14/2014   Polio, Unspecified 01/16/1972, 03/12/1972, 05/08/1972, 05/13/1973   Rubella 01/16/1972   Tdap 07/02/2021   Zoster Recombinant(Shingrix) 07/10/2022    Past Surgical History:  Procedure Laterality Date   ABDOMINAL HYSTERECTOMY  2005   uterus and cervix   SALPINGOOPHORECTOMY Left 2004   Benign ovarian cyst   SALPINGOOPHORECTOMY Right 2014   benign complex R ovarian cyst, irregular bleeding, pelvic pain, adhesions    Social History   Tobacco Use   Smoking status: Every Day    Current packs/day: 1.00    Average packs/day: 1 pack/day for 43.0 years (43.0 ttl pk-yrs)    Types: Cigarettes   Smokeless tobacco: Never  Vaping Use   Vaping status: Never Used  Substance Use Topics   Alcohol use: Not Currently   Drug use: Never    Family History  Problem Relation Age of Onset   Hypertension Mother    Hypertension Father    Cancer Maternal Grandmother         09/13/2022    3:54 PM 08/14/2022    4:00 PM 08/09/2022    3:55 PM 07/10/2022    1:51 PM  GAD 7 : Generalized Anxiety Score  Nervous, Anxious, on Edge 2 2 0 1  Control/stop worrying 1 1 1 1   Worry too much - different things 1 1 2 1   Trouble relaxing 0 0 1 1  Restless 0 0 1 1  Easily annoyed or irritable 2 2 1 2   Afraid - awful might happen 0 1 0 0  Total GAD 7 Score 6 7 6 7   Anxiety Difficulty Not difficult at all Not difficult at all Somewhat difficult Not difficult at all       09/13/2022    3:53 PM 08/14/2022    3:59 PM 08/09/2022    3:54 PM  Depression screen PHQ 2/9  Decreased Interest 2 0 1  Down, Depressed,  Hopeless 2 2 0  PHQ - 2 Score 4 2 1   Altered sleeping 2 2 2   Tired, decreased energy 1 1 1   Change in appetite 0 0 0  Feeling bad or failure about yourself  0 0 0  Trouble concentrating 0 0 0  Moving slowly or fidgety/restless 0 0 1  Suicidal thoughts 0 0 0  PHQ-9 Score 7 5 5   Difficult doing work/chores Not difficult at all Somewhat difficult Somewhat difficult    BP Readings from Last 3 Encounters:  09/13/22 124/86  09/10/22 129/86  08/14/22 120/78    Wt Readings from Last 3 Encounters:  09/13/22 192 lb (87.1 kg)  09/10/22 182 lb 15.7 oz (83 kg)  08/14/22 183 lb (83  kg)    BP 124/86   Pulse 96   Ht 5\' 7"  (1.702 m)   Wt 192 lb (87.1 kg)   SpO2 95%   BMI 30.07 kg/m   Physical Exam Vitals and nursing note reviewed.  Constitutional:      Appearance: Normal appearance.  Cardiovascular:     Rate and Rhythm: Normal rate.  Pulmonary:     Effort: Pulmonary effort is normal.  Abdominal:     General: There is no distension.  Musculoskeletal:        General: Normal range of motion.  Skin:    General: Skin is warm and dry.     Findings: Ecchymosis and wound present.     Comments: Healing scattered abrasions/lacerations on the LEFT arm in a linear distribution. Dressing changed, topical antibiotic applied.  Many circular ecchymoses of various sizes on the RIGHT upper arm, nontender.   Neurological:     Mental Status: She is alert and oriented to person, place, and time.     Gait: Gait is intact.  Psychiatric:        Mood and Affect: Mood and affect normal.      Recent Labs     Component Value Date/Time   NA 139 07/10/2022 1445   NA 138 05/08/2011 1833   K 4.6 07/10/2022 1445   K 4.6 05/08/2011 1833   CL 101 07/10/2022 1445   CL 102 05/08/2011 1833   CO2 21 07/10/2022 1445   CO2 31 05/08/2011 1833   GLUCOSE 92 07/10/2022 1445   GLUCOSE 97 05/08/2011 1833   BUN 30 (H) 07/10/2022 1445   BUN 18 05/08/2011 1833   CREATININE 0.84 07/10/2022 1445   CREATININE  1.02 05/08/2011 1833   CALCIUM 8.9 07/10/2022 1445   CALCIUM 8.8 05/08/2011 1833   PROT 7.3 07/10/2022 1445   PROT 7.5 05/08/2011 1833   ALBUMIN 4.5 07/10/2022 1445   ALBUMIN 3.4 05/08/2011 1833   AST 19 07/10/2022 1445   AST 31 05/08/2011 1833   ALT 15 07/10/2022 1445   ALT 47 05/08/2011 1833   ALKPHOS 112 07/10/2022 1445   ALKPHOS 108 05/08/2011 1833   BILITOT 0.5 07/10/2022 1445   BILITOT 0.3 05/08/2011 1833   GFRNONAA >60 05/08/2011 1833   GFRAA >60 05/08/2011 1833    Lab Results  Component Value Date   WBC 5.7 08/09/2022   HGB 12.7 08/09/2022   HCT 38.5 08/09/2022   MCV 98 (H) 08/09/2022   PLT 253 08/09/2022   No results found for: "HGBA1C" Lab Results  Component Value Date   CHOL 182 07/10/2022   HDL 74 07/10/2022   LDLCALC 95 07/10/2022   TRIG 70 07/10/2022   CHOLHDL 2.5 07/10/2022   Lab Results  Component Value Date   TSH 1.190 07/10/2022     Assessment and Plan:  1. Laceration of left forearm, subsequent encounter Healing nicely, patient reassured. Continue dressing change once daily for the next few days, then she could probably let it breathe and scab. Complete full course of Augmentin.   2. Spontaneous ecchymoses Uncertain etiology. Patient has thin skin generally speaking, may have been incidental minor trauma (such as carrying something in the inner arm). Monitor for now.    Regarding the gabapentin issue, patient informed I am happy to refill rx, perhaps at the 100 mg dose, to hopefully achieve balance between pain mgmt and side effects. She will consider.    Return if symptoms worsen or fail to improve.    Jesusita Oka  Yong Channel, DMSc, Nutritionist [facility] [phone]

## 2022-09-14 ENCOUNTER — Telehealth: Payer: Self-pay | Admitting: Physician Assistant

## 2022-09-14 NOTE — Telephone Encounter (Signed)
Pt is calling to report that she has increased taking the gabapentin to 3 times a day. Pt would like take medication 1 time a day. Pt reports that she has a refill on the 300mg  can she just take that 1 time a day/ [phone]

## 2022-09-14 NOTE — Telephone Encounter (Signed)
Called pt mailbox is full could not leave message.  KP

## 2022-09-14 NOTE — Telephone Encounter (Signed)
Please review.  KP

## 2022-09-15 NOTE — Telephone Encounter (Signed)
Called patient to advise her of provider's message, no answer. Vm was full.

## 2022-10-12 ENCOUNTER — Ambulatory Visit: Payer: Medicaid Other

## 2022-10-12 ENCOUNTER — Ambulatory Visit: Payer: Self-pay

## 2022-10-12 NOTE — Telephone Encounter (Signed)
Chief Complaint: Productive cough with yellow mucus x 1 month.Has COPD. Wheezing, chest pain. Has had rectal bleeding x 5 years. Symptoms: Above Frequency:  Pertinent Negatives: Patient denies  Disposition: [] ED /[] Urgent Care (no appt availability in office) / [x] Appointment(In office/virtual)/ []  [facility]/ [] Home Care/ [] Refused Recommended Disposition /[] [facility]/ []  Follow-up with PCP Additional Notes: Pt. Agrees with appointment. Will go to ED for worsening of symptoms.   Reason for Disposition  [1] Continuous (nonstop) coughing interferes with work or school AND [2] no improvement using cough treatment per Care Advice  MODERATE rectal bleeding (small blood clots, passing blood without stool, or toilet water turns red)  Answer Assessment - Initial Assessment Questions 1. ONSET: "When did the cough begin?"      July 2. SEVERITY: "How bad is the cough today?"      Severe 3. SPUTUM: "Describe the color of your sputum" (none, dry cough; clear, white, yellow, green)     Yellow 4. HEMOPTYSIS: "Are you coughing up any blood?" If so ask: "How much?" (flecks, streaks, tablespoons, etc.)     No 5. DIFFICULTY BREATHING: "Are you having difficulty breathing?" If Yes, ask: "How bad is it?" (e.g., mild, moderate, severe)    - MILD: No SOB at rest, mild SOB with walking, speaks normally in sentences, can lie down, no retractions, pulse < 100.    - MODERATE: SOB at rest, SOB with minimal exertion and prefers to sit, cannot lie down flat, speaks in phrases, mild retractions, audible wheezing, pulse 100-120.    - SEVERE: Very SOB at rest, speaks in single words, struggling to breathe, sitting hunched forward, retractions, pulse > 120      Moderate 6. FEVER: "Do you have a fever?" If Yes, ask: "What is your temperature, how was it measured, and when did it start?"     Low grade 7. CARDIAC HISTORY: "Do you have any history of heart disease?" (e.g., heart attack,  congestive heart failure)      No 8. LUNG HISTORY: "Do you have any history of lung disease?"  (e.g., pulmonary embolus, asthma, emphysema)     copd 9. PE RISK FACTORS: "Do you have a history of blood clots?" (or: recent major surgery, recent prolonged travel, bedridden)     No 10. OTHER SYMPTOMS: "Do you have any other symptoms?" (e.g., runny nose, wheezing, chest pain)       Wheezing, chest 11. PREGNANCY: "Is there any chance you are pregnant?" "When was your last menstrual period?"       No 12. TRAVEL: "Have you traveled out of the country in the last month?" (e.g., travel history, exposures)       No  Answer Assessment - Initial Assessment Questions 1. APPEARANCE of BLOOD: "What color is it?" "Is it passed separately, on the surface of the stool, or mixed in with the stool?"      Bright red 2. AMOUNT: "How much blood was passed?"      Small 3. FREQUENCY: "How many times has blood been passed with the stools?"      Almost every time 4. ONSET: "When was the blood first seen in the stools?" (Days or weeks)      5 years ago 5. DIARRHEA: "Is there also some diarrhea?" If Yes, ask: "How many diarrhea stools in the past 24 hours?"      No 6. CONSTIPATION: "Do you have constipation?" If Yes, ask: "How bad is it?"     No  7. RECURRENT SYMPTOMS: "Have you had blood in your stools before?" If Yes, ask: "When was the last time?" and "What happened that time?"      N/a 8. BLOOD THINNERS: "Do you take any blood thinners?" (e.g., Coumadin/warfarin, Pradaxa/dabigatran, aspirin)     No 9. OTHER SYMPTOMS: "Do you have any other symptoms?"  (e.g., abdomen pain, vomiting, dizziness, fever)     No 10. PREGNANCY: "Is there any chance you are pregnant?" "When was your last menstrual period?"       No  Protocols used: Cough - Acute Non-Productive-A-AH, Rectal Bleeding-A-AH

## 2022-10-13 ENCOUNTER — Ambulatory Visit: Payer: Medicaid Other | Admitting: Physician Assistant

## 2022-10-13 ENCOUNTER — Ambulatory Visit: Payer: Self-pay | Admitting: *Deleted

## 2022-10-13 NOTE — Telephone Encounter (Signed)
Summary: Vomitting & Diarrrhea Advice   Pt is calling to report that she does not want to come to office visit because she has diarrhea and vomiting. Wanting to know is it ok to reschedule?      Called patient [phone] to review sx prior to appt. No answer, LVMTCB [phone].

## 2022-10-13 NOTE — Telephone Encounter (Signed)
Called patient to review sx and rescheduling appt. Patient will need to be seen for sx. Connection with patient on [phone] poor and too much static. Unable to complete a triage call. Attempted to request patient to call back if she can call on another phone.

## 2022-10-13 NOTE — Telephone Encounter (Signed)
  Chief Complaint: Diarrhea, vomiting Symptoms: above - Blood on tissue when wiping Frequency: 2-3 years Pertinent Negatives: Patient denies  Disposition: [] ED /[] Urgent Care (no appt availability in office) / [x] Appointment(In office/virtual)/ []  [facility]/ [] Home Care/ [] Refused Recommended Disposition /[]  Mobile Bus/ []  Follow-up with PCP Additional Notes: Spoke with pt. Pt called to cancel OV for today because she is not feeling well. Pt states that she has to wear a "diaper", because she has so many loose stools a day. Pt states that she has had diarrhea /vomiting for 2-3 years, and has spoken to provider many times. Pt is using OTC meds.  Phone connection  was spotty. Pt was quite tearful.  Pt states she can't sleep well at her house d/t the television being on. Pt also mentioned that "shots" to her shoulder have provided no relief at all.  Made appt for weds to go along with nurse visit for shingles shot.  Please advise. Sent note to [facility] to cancel appt for this afternoon.      Summary: Vomitting & Diarrrhea Advice   Pt is calling to report that she does not want to come to office visit because she has diarrhea and vomiting. Wanting to know is it ok to reschedule?     Reason for Disposition  [1] MODERATE diarrhea (e.g., 4-6 times / day more than normal) AND [2] present > 48 hours (2 days)  Answer Assessment - Initial Assessment Questions 1. DIARRHEA SEVERITY: "How bad is the diarrhea?" "How many more stools have you had in the past 24 hours than normal?"    - NO DIARRHEA (SCALE 0)   - MILD (SCALE 1-3): Few loose or mushy BMs; increase of 1-3 stools over normal daily number of stools; mild increase in ostomy output.   -  MODERATE (SCALE 4-7): Increase of 4-6 stools daily over normal; moderate increase in ostomy output.   -  SEVERE (SCALE 8-10; OR "WORST POSSIBLE"): Increase of 7 or more stools daily over normal; moderate increase in ostomy output;  incontinence.     2-3 years - every BM is loose - 5 today 2. ONSET: "When did the diarrhea begin?"      2-3  3. BM CONSISTENCY: "How loose or watery is the diarrhea?"      Loose  4. VOMITING: "Are you also vomiting?" If Yes, ask: "How many times in the past 24 hours?"      yes  Protocols used: Diarrhea-A-AH

## 2022-10-13 NOTE — Telephone Encounter (Signed)
Second attempt to reach pt. Voice mailbox is full, unable to leave message.

## 2022-10-13 NOTE — Telephone Encounter (Signed)
FYI- Pt has an appointment 10/18/22.  KP  KP

## 2022-10-18 ENCOUNTER — Ambulatory Visit (INDEPENDENT_AMBULATORY_CARE_PROVIDER_SITE_OTHER): Payer: Medicaid Other | Admitting: Physician Assistant

## 2022-10-18 ENCOUNTER — Encounter: Payer: Self-pay | Admitting: Physician Assistant

## 2022-10-18 ENCOUNTER — Ambulatory Visit: Payer: Medicaid Other

## 2022-10-18 VITALS — BP 102/70 | HR 102 | Temp 98.4°F | Ht 67.0 in | Wt 185.0 lb

## 2022-10-18 DIAGNOSIS — D7589 Other specified diseases of blood and blood-forming organs: Secondary | ICD-10-CM | POA: Diagnosis not present

## 2022-10-18 DIAGNOSIS — K625 Hemorrhage of anus and rectum: Secondary | ICD-10-CM | POA: Diagnosis not present

## 2022-10-18 DIAGNOSIS — R5383 Other fatigue: Secondary | ICD-10-CM | POA: Diagnosis not present

## 2022-10-18 DIAGNOSIS — Z23 Encounter for immunization: Secondary | ICD-10-CM

## 2022-10-18 DIAGNOSIS — K529 Noninfective gastroenteritis and colitis, unspecified: Secondary | ICD-10-CM | POA: Diagnosis not present

## 2022-10-18 DIAGNOSIS — Z1321 Encounter for screening for nutritional disorder: Secondary | ICD-10-CM

## 2022-10-18 NOTE — Patient Instructions (Signed)
-  It was a pleasure to see you today! Please review your visit summary for helpful information -Lab results are usually available within 1-2 days and we will call once reviewed -I would encourage you to follow your care via MyChart where you can access lab results, notes, messages, and more -If you feel that we did a nice job today, please complete your after-visit survey and leave us a Google review! Your CMA today was Kieandra and your provider was Dan Waddell, PA-C, DMSc -Please return for follow-up in about 3 months  

## 2022-10-18 NOTE — Progress Notes (Signed)
Date:  10/18/2022   Name:  Natalie Hansen   DOB:  1970-11-12   MRN:  [id number]   Chief Complaint: Diarrhea (Bright red blood )  Diarrhea  This is a chronic problem. Episode onset: X2 years. The problem has been gradually worsening. The stool consistency is described as Blood tinged. The patient states that diarrhea awakens her from sleep. Associated symptoms include abdominal pain, bloating, chills and headaches. The symptoms are aggravated by stress. She has tried increased fluids (antidiarrheal medication) for the symptoms. The treatment provided no relief.   Natalie Hansen presents today for evaluation of acute on chronic diarrhea with recent exacerbation last week, says she was having 5+ episodes of bloody diarrhea daily with blood apparent in the water, not just the toilet paper.  She took an OTC antidiarrheal yesterday and it stopped.  No diarrhea today, feels pretty well all things considered.  Unfortunately she is still not scheduled with [facility].  Overdue for colonoscopy since 2021.  Heavy smoking history.  It seems they were unable to reach her and sent her a letter, but she does not report receiving this letter.  I have reprinted it for her with the contact number for her to call and make this appointment.  She has appointment with pulmonology 11/02/2022 for LDCT/lung cancer screening.  She is also ready to receive her second dose of Shingrix, first 1 administered in May which was fairly well-tolerated aside from a mild rash which may or may not have been related to the vaccine.  She has completely stopped gabapentin which she was using for nerve pain, but said she was having some mood changes (rude) and "talking out of my head".  Last labs in June 2024 notable for macrocytosis with MCV of 99.4 but no anemia.  Compared to prior CBC 07/26/2021 she had macrocytic anemia with MCV 105.  She endorses history of B vitamin and iron deficiency and had to take supplements for  this.   Medication list has been reviewed and updated.  Current Meds  Medication Sig   albuterol (PROVENTIL) (2.5 MG/3ML) 0.083% nebulizer solution Inhale into the lungs.   ALPRAZolam (XANAX XR) 2 MG 24 hr tablet Take 2 mg by mouth 3 (three) times daily.   fluticasone (FLONASE) 50 MCG/ACT nasal spray Place 2 sprays into both nostrils daily.   montelukast (SINGULAIR) 10 MG tablet TAKE 1 TABLET(10 MG) BY MOUTH AT BEDTIME   RABEprazole (ACIPHEX) 20 MG tablet Take by mouth.   SYMBICORT 160-4.5 MCG/ACT inhaler Inhale 2 puffs into the lungs in the morning and at bedtime. Use for COPD maintenance therapy. Rinse mouth after use.   traZODone (DESYREL) 150 MG tablet Take 1 tablet (150 mg total) by mouth at bedtime.   VENTOLIN HFA 108 (90 Base) MCG/ACT inhaler Inhale 2 puffs into the lungs every 4 (four) hours as needed for wheezing or shortness of breath.   [DISCONTINUED] amoxicillin-clavulanate (AUGMENTIN) 875-125 MG tablet Take 1 tablet by mouth every 12 (twelve) hours.     Review of Systems  Constitutional:  Positive for chills.  Gastrointestinal:  Positive for abdominal pain, bloating and diarrhea.  Neurological:  Positive for headaches.    Patient Active Problem List   Diagnosis Date Noted   Chronic diarrhea 10/18/2022   History of intravenous drug use 07/10/2022   Osteoarthritis of left glenohumeral joint 06/27/2022   Rotator cuff dysfunction, left 06/18/2022   Left cervical radiculopathy 06/18/2022   History of hepatitis 06/12/2022   Chronic anemia 06/12/2022  Allergic rhinitis 06/12/2022   Hypertension 06/12/2022   PTSD (post-traumatic stress disorder) 07/02/2021   Anxiety 07/02/2021   Cocaine abuse in remission (HCC) 07/02/2021   ETOH abuse 07/02/2021   At risk for long QT syndrome 07/19/2017   Chronic airway obstruction (HCC) 05/26/2011   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/2011   History of suicide attempt 05/26/2011   Gastritis and gastroduodenitis 07/28/2010   Tobacco  use disorder 07/28/2010   Depressive disorder 07/28/2010   Hepatitis C virus infection cured after antiviral drug therapy 07/28/2010    Allergies  Allergen Reactions   Prednisone Nausea Only    Severe nausea and vomiting   Tramadol Other (See Comments)    "I about went crazy" "it didn't adjust with my system"   Voltaren [Diclofenac] Nausea And Vomiting    Immunization History  Administered Date(s) Administered   Dtap, Unspecified 01/16/1972, 03/12/1972, 05/08/1972, 05/13/1973   Hepb-cpg 11/08/2017   Influenza, Quadrivalent, Recombinant, Inj, Pf 03/30/2020   Influenza,inj,Quad PF,6+ Mos 12/14/2014   Influenza-Unspecified 01/18/2009   Measles 01/16/1972   Moderna Sars-Covid-2 Vaccination 06/12/2019, 07/11/2019   Mumps 10/17/1976   Pneumococcal Polysaccharide-23 12/14/2014   Polio, Unspecified 01/16/1972, 03/12/1972, 05/08/1972, 05/13/1973   Rubella 01/16/1972   Tdap 07/02/2021   Zoster Recombinant(Shingrix) 07/10/2022    Past Surgical History:  Procedure Laterality Date   ABDOMINAL HYSTERECTOMY  2005   uterus and cervix   SALPINGOOPHORECTOMY Left 2004   Benign ovarian cyst   SALPINGOOPHORECTOMY Right 2014   benign complex R ovarian cyst, irregular bleeding, pelvic pain, adhesions    Social History   Tobacco Use   Smoking status: Every Day    Current packs/day: 1.00    Average packs/day: 1 pack/day for 43.0 years (43.0 ttl pk-yrs)    Types: Cigarettes   Smokeless tobacco: Never  Vaping Use   Vaping status: Never Used  Substance Use Topics   Alcohol use: Yes    Comment: 2 drinks on occasion, less than weekly.   Drug use: Never    Family History  Problem Relation Age of Onset   Hypertension Mother    Hypertension Father    Cancer Maternal Grandmother         10/18/2022    1:35 PM 09/13/2022    3:54 PM 08/14/2022    4:00 PM 08/09/2022    3:55 PM  GAD 7 : Generalized Anxiety Score  Nervous, Anxious, on Edge 1 2 2  0  Control/stop worrying 1 1 1 1   Worry  too much - different things 1 1 1 2   Trouble relaxing 1 0 0 1  Restless 1 0 0 1  Easily annoyed or irritable 1 2 2 1   Afraid - awful might happen 1 0 1 0  Total GAD 7 Score 7 6 7 6   Anxiety Difficulty Not difficult at all Not difficult at all Not difficult at all Somewhat difficult       10/18/2022    1:34 PM 09/13/2022    3:53 PM 08/14/2022    3:59 PM  Depression screen PHQ 2/9  Decreased Interest 2 2 0  Down, Depressed, Hopeless 1 2 2   PHQ - 2 Score 3 4 2   Altered sleeping 3 2 2   Tired, decreased energy 2 1 1   Change in appetite 2 0 0  Feeling bad or failure about yourself  0 0 0  Trouble concentrating 1 0 0  Moving slowly or fidgety/restless 2 0 0  Suicidal thoughts 0 0 0  PHQ-9 Score 13 7 5  Difficult doing work/chores Somewhat difficult Not difficult at all Somewhat difficult    BP Readings from Last 3 Encounters:  10/18/22 102/70  09/13/22 124/86  09/10/22 129/86    Wt Readings from Last 3 Encounters:  10/18/22 185 lb (83.9 kg)  09/13/22 192 lb (87.1 kg)  09/10/22 182 lb 15.7 oz (83 kg)    BP 102/70 (BP Location: Right Arm, Patient Position: Sitting, Cuff Size: Normal)   Pulse (!) 102   Temp 98.4 F (36.9 C) (Oral)   Ht 5\' 7"  (1.702 m)   Wt 185 lb (83.9 kg)   SpO2 99%   BMI 28.98 kg/m   Physical Exam Vitals and nursing note reviewed.  Constitutional:      Appearance: Normal appearance.  Cardiovascular:     Rate and Rhythm: Normal rate and regular rhythm.     Heart sounds: No murmur heard.    No friction rub. No gallop.  Pulmonary:     Effort: Pulmonary effort is normal.     Breath sounds: Wheezing present.  Abdominal:     General: There is no distension.  Musculoskeletal:        General: Normal range of motion.  Skin:    General: Skin is warm and dry.  Neurological:     Mental Status: She is alert and oriented to person, place, and time.     Gait: Gait is intact.  Psychiatric:        Mood and Affect: Mood and affect normal.     Recent  Labs     Component Value Date/Time   NA 139 07/10/2022 1445   NA 138 05/08/2011 1833   K 4.6 07/10/2022 1445   K 4.6 05/08/2011 1833   CL 101 07/10/2022 1445   CL 102 05/08/2011 1833   CO2 21 07/10/2022 1445   CO2 31 05/08/2011 1833   GLUCOSE 92 07/10/2022 1445   GLUCOSE 97 05/08/2011 1833   BUN 30 (H) 07/10/2022 1445   BUN 18 05/08/2011 1833   CREATININE 0.84 07/10/2022 1445   CREATININE 1.02 05/08/2011 1833   CALCIUM 8.9 07/10/2022 1445   CALCIUM 8.8 05/08/2011 1833   PROT 7.3 07/10/2022 1445   PROT 7.5 05/08/2011 1833   ALBUMIN 4.5 07/10/2022 1445   ALBUMIN 3.4 05/08/2011 1833   AST 19 07/10/2022 1445   AST 31 05/08/2011 1833   ALT 15 07/10/2022 1445   ALT 47 05/08/2011 1833   ALKPHOS 112 07/10/2022 1445   ALKPHOS 108 05/08/2011 1833   BILITOT 0.5 07/10/2022 1445   BILITOT 0.3 05/08/2011 1833   GFRNONAA >60 05/08/2011 1833   GFRAA >60 05/08/2011 1833    Lab Results  Component Value Date   WBC 5.7 08/09/2022   HGB 12.7 08/09/2022   HCT 38.5 08/09/2022   MCV 98 (H) 08/09/2022   PLT 253 08/09/2022   No results found for: "HGBA1C" Lab Results  Component Value Date   CHOL 182 07/10/2022   HDL 74 07/10/2022   LDLCALC 95 07/10/2022   TRIG 70 07/10/2022   CHOLHDL 2.5 07/10/2022   Lab Results  Component Value Date   TSH 1.190 07/10/2022     Assessment and Plan:  1. Chronic diarrhea Continue OTC diarrheal as needed, this seems to be effective.  Emphasized the importance of following up with GI, overdue for colonoscopy but they could also evaluate her diarrhea complaint.  2. Rectal bleeding Plan as above.  Repeat CBC and iron labs today given recent blood loss and history of anemia.  Encouraged to follow-up with GI.  Some suspicion for inflammatory bowel, but also due for CRC screen - CBC with Differential/Platelet - Iron, TIBC and Ferritin Panel  3. Fatigue, unspecified type Check fatigue labs as below - CBC with Differential/Platelet - B12 and Folate  Panel - VITAMIN D 25 Hydroxy (Vit-D Deficiency, Fractures) - Iron, TIBC and Ferritin Panel  4. Macrocytosis Check B12 and folate with history of B vitamin deficiency - B12 and Folate Panel  5. Encounter for vitamin deficiency screening Check for vitamin deficiency - B12 and Folate Panel - VITAMIN D 25 Hydroxy (Vit-D Deficiency, Fractures)  6. Need for shingles vaccine Shingrix dose 2 administered today, immunization complete. - Varicella-zoster vaccine IM   Return in about 3 months (around 01/18/2023) for OV f/u chronic conditions.    Alvester Morin, PA-C, DMSc, Nutritionist [facility] [phone]

## 2022-11-01 ENCOUNTER — Telehealth: Payer: Self-pay

## 2022-11-01 NOTE — Telephone Encounter (Signed)
Called pt as a reminder to get labs done. Pts phone went straight to VM. VM was full could not leave VM  KP

## 2022-11-01 NOTE — Telephone Encounter (Signed)
-----   Message from Allen Norris sent at 10/26/2022 11:41 AM EDT ----- Regarding: No labs Please follow-up with Natalie Hansen as it seems she did not have her labs drawn last week. ----- Message ----- From: SYSTEM Sent: 10/23/2022  12:15 AM EDT To: Remo Lipps, PA

## 2022-11-02 ENCOUNTER — Institutional Professional Consult (permissible substitution): Payer: Medicaid Other | Admitting: Student in an Organized Health Care Education/Training Program

## 2022-12-14 ENCOUNTER — Other Ambulatory Visit: Payer: Self-pay | Admitting: Physician Assistant

## 2022-12-14 DIAGNOSIS — J449 Chronic obstructive pulmonary disease, unspecified: Secondary | ICD-10-CM

## 2022-12-14 NOTE — Telephone Encounter (Signed)
Requested Prescriptions  Pending Prescriptions Disp Refills   SYMBICORT 160-4.5 MCG/ACT inhaler [Pharmacy Med Name: SYMBICORT 160/4. (120 ORAL INH)] 10.2 g 5    Sig: INHALE 2 PUFFS INTO THE LUNGS IN THE MORNING AND 2 PUFFS AT BEDTIME FOR COPD MAINTENANCE THERAPY. RINSE MOUTH AFTER USE.     Pulmonology:  Combination Products Passed - 12/14/2022  2:13 PM      Passed - Valid encounter within last 12 months    Recent Outpatient Visits           1 month ago Chronic diarrhea   [place] at [facility], [place], [place]   3 months ago Laceration of left forearm, subsequent encounter   [facility] at [facility], [place], [place]   4 months ago Left arm pain   [facility] at [facility], [place], [place]   4 months ago Left arm pain   [facility] at [facility], [place], [place]   5 months ago Annual physical exam   [facility] at [facility], [place], [place]       Future Appointments             In 1 month Mordecai Maes, [place], [place] [facility] at [facility], [facility]

## 2023-01-18 ENCOUNTER — Ambulatory Visit: Payer: Medicaid Other | Admitting: Physician Assistant

## 2023-03-22 DIAGNOSIS — F1994 Other psychoactive substance use, unspecified with psychoactive substance-induced mood disorder: Secondary | ICD-10-CM | POA: Insufficient documentation

## 2023-03-22 DIAGNOSIS — R419 Unspecified symptoms and signs involving cognitive functions and awareness: Secondary | ICD-10-CM | POA: Insufficient documentation

## 2023-03-23 ENCOUNTER — Emergency Department
Admit: 2023-03-23 | Discharge: 2023-03-23 | Disposition: A | Discharge status: Other Acute Inpatient Hospital | Payer: PRIVATE HEALTH INSURANCE

## 2023-04-12 ENCOUNTER — Encounter: Payer: Self-pay | Admitting: Physician Assistant

## 2023-04-12 ENCOUNTER — Ambulatory Visit (INDEPENDENT_AMBULATORY_CARE_PROVIDER_SITE_OTHER): Payer: Medicaid Other | Admitting: Physician Assistant

## 2023-04-12 VITALS — BP 110/76 | HR 97 | Temp 98.3°F | Ht 67.0 in | Wt 141.0 lb

## 2023-04-12 DIAGNOSIS — R350 Frequency of micturition: Secondary | ICD-10-CM | POA: Diagnosis not present

## 2023-04-12 DIAGNOSIS — F17200 Nicotine dependence, unspecified, uncomplicated: Secondary | ICD-10-CM

## 2023-04-12 DIAGNOSIS — R112 Nausea with vomiting, unspecified: Secondary | ICD-10-CM | POA: Diagnosis not present

## 2023-04-12 DIAGNOSIS — D649 Anemia, unspecified: Secondary | ICD-10-CM

## 2023-04-12 DIAGNOSIS — J449 Chronic obstructive pulmonary disease, unspecified: Secondary | ICD-10-CM

## 2023-04-12 DIAGNOSIS — E440 Moderate protein-calorie malnutrition: Secondary | ICD-10-CM

## 2023-04-12 DIAGNOSIS — Z91199 Patient's noncompliance with other medical treatment and regimen due to unspecified reason: Secondary | ICD-10-CM

## 2023-04-12 DIAGNOSIS — K625 Hemorrhage of anus and rectum: Secondary | ICD-10-CM

## 2023-04-12 LAB — POCT URINALYSIS DIPSTICK
Bilirubin, UA: NEGATIVE
Blood, UA: NEGATIVE
Glucose, UA: NEGATIVE
Ketones, UA: NEGATIVE
Leukocytes, UA: NEGATIVE
Nitrite, UA: NEGATIVE
Protein, UA: POSITIVE — AB
Spec Grav, UA: 1.015 (ref 1.010–1.025)
Urobilinogen, UA: 0.2 U/dL
pH, UA: 6 (ref 5.0–8.0)

## 2023-04-12 MED ORDER — PROMETHAZINE HCL 25 MG PO TABS
25.0000 mg | ORAL_TABLET | Freq: Three times a day (TID) | ORAL | 2 refills | Status: DC | PRN
Start: 2023-04-12 — End: 2023-12-10

## 2023-04-12 MED ORDER — RABEPRAZOLE SODIUM 20 MG PO TBEC: 20.0000 mg | DELAYED_RELEASE_TABLET | Freq: Every day | ORAL | 2 refills | Status: DC

## 2023-04-12 MED ORDER — NITROFURANTOIN MONOHYD MACRO 100 MG PO CAPS: 100.0000 mg | ORAL_CAPSULE | Freq: Two times a day (BID) | ORAL | 0 refills | Status: AC

## 2023-04-12 NOTE — Progress Notes (Signed)
Date:  04/12/2023   Name:  Natalie Hansen   DOB:  [birth year]   MRN:  [id number]   Chief Complaint: Urinary Frequency and Rectal Bleeding  Urinary Frequency  This is a new problem. Episode onset: X2 weeks. The problem occurs every urination. The problem has been gradually worsening. The patient is experiencing no pain. There has been no fever. She is Sexually active. There is No history of pyelonephritis. Associated symptoms include chills, flank pain, frequency and urgency. She has tried nothing for the symptoms.  Rectal Bleeding  Episode onset: X1 year. The problem occurs continuously. The problem has been gradually worsening. The pain is moderate. The stool is described as mixed with blood. Associated symptoms include rectal pain. She has been Eating less than usual. Urine output has increased. The last void occurred 13 to 24 hours ago. There were no sick contacts.   Natalie Hansen returns to the clinic today overdue for follow up with some very concerning recent developments in her health. Joined by her partner today who is going to ensure she follows up on recommendations, given her history of noncompliance. She has lost 44 lb in the last 6 months.  She continues to have rectal bleeding which she has mentioned at previous visits.  Has no appetite, complains of nausea and vomiting.  Unfortunately she is still not scheduled with [facility].  Overdue for colonoscopy since 2021.  Heavy smoking history.  It seems they were unable to reach her and sent her a letter, but she does not report receiving this letter.  I have previously reprinted it for her with the contact number for her to call and make this appointment.   She had appointment with pulmonology 11/02/2022 for LDCT/lung cancer screening but did not go.    Did not go to have her labs done last visit. Last labs in June 2024 notable for macrocytosis with MCV of 99.4 but no anemia.  Compared to prior CBC 07/26/2021 she had macrocytic anemia with MCV  105.  She endorses history of B vitamin and iron deficiency and had to take supplements for this.   Recently seen in ED 03/22/23 and diagnosed with bipolar with SI. Labs from 03/22/2023 significant for moderate macrocytic anemia with hemoglobin 9.1, leukocytosis at 14.7 with neutrophil predominance, slight anisocytosis, low albumin at 2.5, large leukocyte esterase on dipstick, tox screen with benzos and cannabinoids. Transferred to [facility] psychiatric facility, now seeing outpatient psych who is managing all behavioral health meds.    Medication list has been reviewed and updated.  Current Meds  Medication Sig   albuterol (PROVENTIL) (2.5 MG/3ML) 0.083% nebulizer solution Inhale into the lungs.   chlordiazePOXIDE (LIBRIUM) 25 MG capsule Take 50 mg by mouth at bedtime as needed for anxiety.   lurasidone (LATUDA) 40 MG TABS tablet Take 40 mg by mouth at bedtime.   nitrofurantoin, macrocrystal-monohydrate, (MACROBID) 100 MG capsule Take 1 capsule (100 mg total) by mouth 2 (two) times daily for 5 days.   promethazine (PHENERGAN) 25 MG tablet Take 1 tablet (25 mg total) by mouth every 8 (eight) hours as needed for nausea or vomiting.   SYMBICORT 160-4.5 MCG/ACT inhaler INHALE 2 PUFFS INTO THE LUNGS IN THE MORNING AND 2 PUFFS AT BEDTIME FOR COPD MAINTENANCE THERAPY. RINSE MOUTH AFTER USE.   topiramate (TOPAMAX) 100 MG tablet Take 150 mg by mouth 2 (two) times daily.   traZODone (DESYREL) 150 MG tablet Take 1 tablet (150 mg total) by mouth at bedtime.  VENTOLIN HFA 108 (90 Base) MCG/ACT inhaler Inhale 2 puffs into the lungs every 4 (four) hours as needed for wheezing or shortness of breath.   [DISCONTINUED] alprazolam (XANAX) 2 MG tablet Take 2 mg by mouth.     Review of Systems  Constitutional:  Positive for chills.  Gastrointestinal:  Positive for hematochezia and rectal pain.  Genitourinary:  Positive for flank pain, frequency and urgency.    Patient Active Problem List   Diagnosis Date  Noted   Noncompliance 04/12/2023   Moderate protein-calorie malnutrition (HCC) 04/12/2023   Chronic diarrhea 10/18/2022   History of intravenous drug use 07/10/2022   Osteoarthritis of left glenohumeral joint 06/27/2022   Rotator cuff dysfunction, left 06/18/2022   Left cervical radiculopathy 06/18/2022   History of hepatitis 06/12/2022   Chronic anemia 06/12/2022   Allergic rhinitis 06/12/2022   Hypertension 06/12/2022   PTSD (post-traumatic stress disorder) 07/02/2021   Anxiety 07/02/2021   Cocaine abuse in remission (HCC) 07/02/2021   ETOH abuse 07/02/2021   At risk for long QT syndrome 07/19/2017   COPD (chronic obstructive pulmonary disease) (HCC) 05/26/2011   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/2011   History of suicide attempt 05/26/2011   Gastritis and gastroduodenitis 07/28/2010   Tobacco use disorder 07/28/2010   Depressive disorder 07/28/2010   Hepatitis C virus infection cured after antiviral drug therapy 07/28/2010    Allergies  Allergen Reactions   Prednisone Nausea Only    Severe nausea and vomiting   Tramadol Other (See Comments)    "I about went crazy" "it didn't adjust with my system"   Voltaren [Diclofenac] Nausea And Vomiting    Immunization History  Administered Date(s) Administered   Dtap, Unspecified 01/16/1972, 03/12/1972, 05/08/1972, 05/13/1973   Hepb-cpg 11/08/2017   Influenza, Quadrivalent, Recombinant, Inj, Pf 03/30/2020   Influenza,inj,Quad PF,6+ Mos 12/14/2014   Influenza-Unspecified 01/18/2009   Measles 01/16/1972   Moderna Sars-Covid-2 Vaccination 06/12/2019, 07/11/2019   Mumps 10/17/1976   Pneumococcal Polysaccharide-23 12/14/2014   Polio, Unspecified 01/16/1972, 03/12/1972, 05/08/1972, 05/13/1973   Rubella 01/16/1972   Tdap 07/02/2021   Zoster Recombinant(Shingrix) 07/10/2022, 10/18/2022    Past Surgical History:  Procedure Laterality Date   ABDOMINAL HYSTERECTOMY  2005   uterus and cervix   SALPINGOOPHORECTOMY Left 2004    Benign ovarian cyst   SALPINGOOPHORECTOMY Right 2014   benign complex R ovarian cyst, irregular bleeding, pelvic pain, adhesions    Social History   Tobacco Use   Smoking status: Every Day    Current packs/day: 1.00    Average packs/day: 1 pack/day for 43.0 years (43.0 ttl pk-yrs)    Types: Cigarettes   Smokeless tobacco: Never  Vaping Use   Vaping status: Never Used  Substance Use Topics   Alcohol use: Not Currently   Drug use: Never    Family History  Problem Relation Age of Onset   Hypertension Mother    Hypertension Father    Cancer Maternal Grandmother         04/12/2023    1:44 PM 10/18/2022    1:35 PM 09/13/2022    3:54 PM 08/14/2022    4:00 PM  GAD 7 : Generalized Anxiety Score  Nervous, Anxious, on Edge 1 1 2 2   Control/stop worrying 2 1 1 1   Worry too much - different things 2 1 1 1   Trouble relaxing 2 1 0 0  Restless 2 1 0 0  Easily annoyed or irritable 2 1 2 2   Afraid - awful might happen 2 1 0 1  Total GAD 7 Score 13 7 6 7   Anxiety Difficulty Somewhat difficult Not difficult at all Not difficult at all Not difficult at all       04/12/2023    1:44 PM 10/18/2022    1:34 PM 09/13/2022    3:53 PM  Depression screen PHQ 2/9  Decreased Interest 1 2 2   Down, Depressed, Hopeless 0 1 2  PHQ - 2 Score 1 3 4   Altered sleeping  3 2  Tired, decreased energy  2 1  Change in appetite  2 0  Feeling bad or failure about yourself   0 0  Trouble concentrating  1 0  Moving slowly or fidgety/restless  2 0  Suicidal thoughts  0 0  PHQ-9 Score  13 7  Difficult doing work/chores  Somewhat difficult Not difficult at all    BP Readings from Last 3 Encounters:  04/12/23 110/76  10/18/22 102/70  09/13/22 124/86    Wt Readings from Last 3 Encounters:  04/12/23 141 lb (64 kg)  10/18/22 185 lb (83.9 kg)  09/13/22 192 lb (87.1 kg)    BP 110/76   Pulse 97   Temp 98.3 F (36.8 C)   Ht 5\' 7"  (1.702 m)   Wt 141 lb (64 kg)   SpO2 97%   BMI 22.08 kg/m    Physical Exam Vitals and nursing note reviewed.  Constitutional:      Comments: Weight loss very apparent, BMI presently 22.   Cardiovascular:     Rate and Rhythm: Normal rate and regular rhythm.     Heart sounds: No murmur heard.    No friction rub. No gallop.  Pulmonary:     Effort: Pulmonary effort is normal.     Breath sounds: Decreased breath sounds, wheezing and rhonchi present. No rales.  Abdominal:     General: There is no distension.  Musculoskeletal:        General: Normal range of motion.  Skin:    General: Skin is warm and dry.     Comments: Scattered small ecchymoses of arms  Neurological:     Mental Status: She is alert and oriented to person, place, and time.     Gait: Gait is intact.  Psychiatric:        Mood and Affect: Mood normal. Affect is tearful.     Lab Results  Component Value Date   COLORU yellow 04/12/2023   CLARITYU clear 04/12/2023   GLUCOSEUR Negative 04/12/2023   BILIRUBINUR neg 04/12/2023   KETONESU neg 04/12/2023   SPECGRAV 1.015 04/12/2023   RBCUR neg 04/12/2023   PHUR 6.0 04/12/2023   PROTEINUR Positive (A) 04/12/2023   UROBILINOGEN 0.2 04/12/2023   LEUKOCYTESUR Negative 04/12/2023     Recent Labs     Component Value Date/Time   NA 139 07/10/2022 1445   NA 138 05/08/2011 1833   K 4.6 07/10/2022 1445   K 4.6 05/08/2011 1833   CL 101 07/10/2022 1445   CL 102 05/08/2011 1833   CO2 21 07/10/2022 1445   CO2 31 05/08/2011 1833   GLUCOSE 92 07/10/2022 1445   GLUCOSE 97 05/08/2011 1833   BUN 30 (H) 07/10/2022 1445   BUN 18 05/08/2011 1833   CREATININE 0.84 07/10/2022 1445   CREATININE 1.02 05/08/2011 1833   CALCIUM 8.9 07/10/2022 1445   CALCIUM 8.8 05/08/2011 1833   PROT 7.3 07/10/2022 1445   PROT 7.5 05/08/2011 1833   ALBUMIN 4.5 07/10/2022 1445   ALBUMIN 3.4 05/08/2011 1833  AST 19 07/10/2022 1445   AST 31 05/08/2011 1833   ALT 15 07/10/2022 1445   ALT 47 05/08/2011 1833   ALKPHOS 112 07/10/2022 1445   ALKPHOS 108  05/08/2011 1833   BILITOT 0.5 07/10/2022 1445   BILITOT 0.3 05/08/2011 1833   GFRNONAA >60 05/08/2011 1833   GFRAA >60 05/08/2011 1833    Lab Results  Component Value Date   WBC 5.7 08/09/2022   HGB 12.7 08/09/2022   HCT 38.5 08/09/2022   MCV 98 (H) 08/09/2022   PLT 253 08/09/2022   No results found for: "HGBA1C" Lab Results  Component Value Date   CHOL 182 07/10/2022   HDL 74 07/10/2022   LDLCALC 95 07/10/2022   TRIG 70 07/10/2022   CHOLHDL 2.5 07/10/2022   Lab Results  Component Value Date   TSH 1.190 07/10/2022     Assessment and Plan:  1. Moderate protein-calorie malnutrition (HCC) (Primary) Low albumin on recent labs, has nearly lost 45 pounds in the last 6 months.  At this time I feel that cancer is high on the differential, need to complete screening for colon and lung cancer, but consider also hematologic cancers.  Repeat CMP today.  Hopefully prescribing antiemetic will allow her to eat. - Comprehensive metabolic panel  2. Noncompliance I truly feel like this is her biggest health barrier.  Strongly emphasized the importance of her getting blood work today and following up with pulmonology and GI for her cancer screenings.  3. Urinary frequency Dipstick significant only for proteinuria today, but large leukocytes on her last dipstick a few weeks ago in the context of persistent urinary frequency.  Will treat presumptively with Macrobid as below. - POCT Urinalysis Dipstick - nitrofurantoin, macrocrystal-monohydrate, (MACROBID) 100 MG capsule; Take 1 capsule (100 mg total) by mouth 2 (two) times daily for 5 days.  Dispense: 10 capsule; Refill: 0  4. Nausea and vomiting, unspecified vomiting type Refill rabeprazole and promethazine as below - RABEprazole (ACIPHEX) 20 MG tablet; Take 1 tablet (20 mg total) by mouth daily.  Dispense: 30 tablet; Refill: 2 - promethazine (PHENERGAN) 25 MG tablet; Take 1 tablet (25 mg total) by mouth every 8 (eight) hours as needed for  nausea or vomiting.  Dispense: 30 tablet; Refill: 2  5. Tobacco use disorder Strongly encouraged smoking cessation, patient is uninterested at this time.  6. Chronic obstructive pulmonary disease, unspecified COPD type (HCC) Discussed that continued smoking will only make COPD worse, and decrease her quality of life while increasing cancer risk.  She still intends to continue smoking  7. Anemia, unspecified type Patient has existing orders from August that I have instructed her to complete today.  These include CBC with differential, iron panel, B12, B9, and vitamin D.  8. Rectal bleeding Check CBC, follow-up with GI.   F/u TBD pending labs and specialist appts  I have once again written down the specialist clinics and phone numbers for her to call and make appointments as soon as possible, recommended she has to be put on a wait list as well.  These numbers were given to her partner to ensure this is addressed.   Alvester Morin, PA-C, DMSc, Nutritionist [facility] Primary Care and Sports Medicine [facility] [phone]

## 2023-04-13 ENCOUNTER — Telehealth: Payer: Self-pay | Admitting: Physician Assistant

## 2023-04-13 LAB — COMPREHENSIVE METABOLIC PANEL
ALT: 10 [IU]/L (ref 0–32)
AST: 16 [IU]/L (ref 0–40)
Albumin: 4 g/dL (ref 3.8–4.9)
Alkaline Phosphatase: 96 [IU]/L (ref 44–121)
BUN/Creatinine Ratio: 18 (ref 9–23)
BUN: 17 mg/dL (ref 6–24)
Bilirubin Total: 0.3 mg/dL (ref 0.0–1.2)
CO2: 21 mmol/L (ref 20–29)
Calcium: 9.7 mg/dL (ref 8.7–10.2)
Chloride: 106 mmol/L (ref 96–106)
Creatinine, Ser: 0.95 mg/dL (ref 0.57–1.00)
Globulin, Total: 3.6 g/dL (ref 1.5–4.5)
Glucose: 106 mg/dL — ABNORMAL HIGH (ref 70–99)
Potassium: 3.8 mmol/L (ref 3.5–5.2)
Sodium: 143 mmol/L (ref 134–144)
Total Protein: 7.6 g/dL (ref 6.0–8.5)
eGFR: 72 mL/min/{1.73_m2} (ref >59)

## 2023-04-13 NOTE — Telephone Encounter (Signed)
Copied from CRM [id number]. Topic: Clinical - Lab/Test Results >> Apr 13, 2023 12:31 PM Shon Hale wrote: Reason for CRM: PT received call from office, pt returning call. Pt inquiring about her labs, would like  a call as soon as thy are available.  Best Cb: [phone]

## 2023-04-16 ENCOUNTER — Other Ambulatory Visit: Payer: Self-pay | Admitting: Physician Assistant

## 2023-04-16 MED ORDER — OMEPRAZOLE 20 MG PO CPDR: 20.0000 mg | DELAYED_RELEASE_CAPSULE | Freq: Every day | ORAL | 1 refills | Status: DC

## 2023-04-16 NOTE — Progress Notes (Signed)
 Aciphex not covered by insurance, sending omeprazole instead

## 2023-04-16 NOTE — Telephone Encounter (Signed)
 I was able to speak with patient this morning.  Advised her that her cell phone was turned off and voicemail was full, recommended she turn on her cell phone and clear her voicemail so that we can reach her especially for specialist referrals.  Reviewed CMP, which was normal, but advised that she will need to return to [facility] for the remainder of her labs to be drawn (ordered in Aug 2024).  She plans to come in tomorrow.  Also notified that I received a fax from [facility] stating that rabeprazole is not covered by her insurance.  Sent omeprazole instead.

## 2023-04-25 LAB — B12 AND FOLATE PANEL
Folate: 6.1 ng/mL (ref >3.0)
Vitamin B-12: 560 pg/mL (ref 232–1245)

## 2023-04-25 LAB — CBC WITH DIFFERENTIAL/PLATELET
Basophils Absolute: 0.1 10*3/uL (ref 0.0–0.2)
Basos: 1 %
EOS (ABSOLUTE): 0.1 10*3/uL (ref 0.0–0.4)
Eos: 2 %
Hematocrit: 34.2 % (ref 34.0–46.6)
Hemoglobin: 11.2 g/dL (ref 11.1–15.9)
Immature Grans (Abs): 0 10*3/uL (ref 0.0–0.1)
Immature Granulocytes: 0 %
Lymphocytes Absolute: 1.7 10*3/uL (ref 0.7–3.1)
Lymphs: 33 %
MCH: 32.4 pg (ref 26.6–33.0)
MCHC: 32.7 g/dL (ref 31.5–35.7)
MCV: 99 fL — ABNORMAL HIGH (ref 79–97)
Monocytes Absolute: 0.3 10*3/uL (ref 0.1–0.9)
Monocytes: 5 %
Neutrophils Absolute: 3.1 10*3/uL (ref 1.4–7.0)
Neutrophils: 59 %
Platelets: 79 10*3/uL — CL (ref 150–450)
RBC: 3.46 x10E6/uL — ABNORMAL LOW (ref 3.77–5.28)
RDW: 12.8 % (ref 11.7–15.4)
WBC: 5.3 10*3/uL (ref 3.4–10.8)

## 2023-04-25 LAB — IRON,TIBC AND FERRITIN PANEL
Ferritin: 371 ng/mL — ABNORMAL HIGH (ref 15–150)
Iron Saturation: 24 % (ref 15–55)
Iron: 61 ug/dL (ref 27–159)
Total Iron Binding Capacity: 255 ug/dL (ref 250–450)
UIBC: 194 ug/dL (ref 131–425)

## 2023-04-25 LAB — VITAMIN D 25 HYDROXY (VIT D DEFICIENCY, FRACTURES): Vit D, 25-Hydroxy: 22.4 ng/mL — ABNORMAL LOW (ref 30.0–100.0)

## 2023-04-27 ENCOUNTER — Encounter: Payer: Self-pay | Admitting: Physician Assistant

## 2023-04-27 DIAGNOSIS — D696 Thrombocytopenia, unspecified: Secondary | ICD-10-CM | POA: Insufficient documentation

## 2023-05-01 ENCOUNTER — Telehealth: Payer: Self-pay

## 2023-05-01 ENCOUNTER — Ambulatory Visit: Payer: Medicaid Other | Admitting: Physician Assistant

## 2023-05-01 ENCOUNTER — Encounter: Payer: Self-pay | Admitting: Physician Assistant

## 2023-05-01 VITALS — BP 112/78 | HR 87 | Temp 98.4°F | Ht 67.0 in | Wt 145.0 lb

## 2023-05-01 DIAGNOSIS — D696 Thrombocytopenia, unspecified: Secondary | ICD-10-CM | POA: Diagnosis not present

## 2023-05-01 DIAGNOSIS — Z87891 Personal history of nicotine dependence: Secondary | ICD-10-CM

## 2023-05-01 DIAGNOSIS — F17210 Nicotine dependence, cigarettes, uncomplicated: Secondary | ICD-10-CM

## 2023-05-01 DIAGNOSIS — Z122 Encounter for screening for malignant neoplasm of respiratory organs: Secondary | ICD-10-CM

## 2023-05-01 NOTE — Progress Notes (Signed)
 Date:  05/01/2023   Name:  Natalie Hansen   DOB:  25-Feb-1971   MRN:  [id number]   Chief Complaint: Labs Only  HPI Charma returns to clinic today mainly to discuss recent lab findings of significant thrombocytopenia of 79K on 04/17/2023 with borderline anemia but no leukocytosis.  Her CBC from the month prior had platelets at 467K.  We are planning repeat CBC today.  Thankfully, she has finally been able to schedule a consult with pulmonology for COPD on 05/10/2023, long overdue for cancer screening.  She has an LDCT scheduled for 05/18/2023.  Unfortunately, she has still not been able to schedule her colonoscopy.  They were not able to reach her, and when her spouse call the GI office, he states that there was no option to schedule, so he left a voicemail.  Overall, she feels a little bit better today compared to last visit.  Her spouse states that she seems to have more color, and is more conversational.  However, she still feels very fatigued with low mood.  Recently started multivitamin.  Medication list has been reviewed and updated.  Current Meds  Medication Sig   albuterol (PROVENTIL) (2.5 MG/3ML) 0.083% nebulizer solution Inhale into the lungs.   chlordiazePOXIDE (LIBRIUM) 25 MG capsule Take 50 mg by mouth at bedtime as needed for anxiety.   FOLIC ACID PO Take by mouth.   lurasidone (LATUDA) 40 MG TABS tablet Take 40 mg by mouth at bedtime.   Multiple Vitamins-Minerals (CENTRUM SILVER PO) Take by mouth.   omeprazole (PRILOSEC) 20 MG capsule Take 1 capsule (20 mg total) by mouth daily.   promethazine (PHENERGAN) 25 MG tablet Take 1 tablet (25 mg total) by mouth every 8 (eight) hours as needed for nausea or vomiting.   SYMBICORT 160-4.5 MCG/ACT inhaler INHALE 2 PUFFS INTO THE LUNGS IN THE MORNING AND 2 PUFFS AT BEDTIME FOR COPD MAINTENANCE THERAPY. RINSE MOUTH AFTER USE.   Thiamine HCl (VITAMIN B1 PO) Take by mouth.   topiramate (TOPAMAX) 100 MG tablet Take 150 mg by mouth 2 (two)  times daily.   traZODone (DESYREL) 150 MG tablet Take 1 tablet (150 mg total) by mouth at bedtime.   VENTOLIN HFA 108 (90 Base) MCG/ACT inhaler Inhale 2 puffs into the lungs every 4 (four) hours as needed for wheezing or shortness of breath.     Review of Systems  Patient Active Problem List   Diagnosis Date Noted   Thrombocytopenia (HCC) 04/27/2023   Noncompliance 04/12/2023   Moderate protein-calorie malnutrition (HCC) 04/12/2023   Chronic diarrhea 10/18/2022   History of intravenous drug use 07/10/2022   Osteoarthritis of left glenohumeral joint 06/27/2022   Rotator cuff dysfunction, left 06/18/2022   Left cervical radiculopathy 06/18/2022   History of hepatitis 06/12/2022   Chronic anemia 06/12/2022   Allergic rhinitis 06/12/2022   Hypertension 06/12/2022   PTSD (post-traumatic stress disorder) 07/02/2021   Anxiety 07/02/2021   Cocaine abuse in remission (HCC) 07/02/2021   ETOH abuse 07/02/2021   At risk for long QT syndrome 07/19/2017   COPD (chronic obstructive pulmonary disease) (HCC) 05/26/2011   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/2011   History of suicide attempt 05/26/2011   Gastritis and gastroduodenitis 07/28/2010   Tobacco use disorder 07/28/2010   Depressive disorder 07/28/2010   Hepatitis C virus infection cured after antiviral drug therapy 07/28/2010    Allergies  Allergen Reactions   Prednisone Nausea Only    Severe nausea and vomiting   Tramadol Other (See Comments)    "  I about went crazy" "it didn't adjust with my system"   Voltaren [Diclofenac] Nausea And Vomiting    Immunization History  Administered Date(s) Administered   Dtap, Unspecified 01/16/1972, 03/12/1972, 05/08/1972, 05/13/1973   Hepb-cpg 11/08/2017   Influenza, Quadrivalent, Recombinant, Inj, Pf 03/30/2020   Influenza,inj,Quad PF,6+ Mos 12/14/2014   Influenza-Unspecified 01/18/2009   Measles 01/16/1972   Moderna Sars-Covid-2 Vaccination 06/12/2019, 07/11/2019, 03/30/2020   Mumps  10/17/1976   Pneumococcal Polysaccharide-23 12/14/2014   Polio, Unspecified 01/16/1972, 03/12/1972, 05/08/1972, 05/13/1973   Rubella 01/16/1972   Tdap 07/02/2021   Zoster Recombinant(Shingrix) 07/10/2022, 10/18/2022    Past Surgical History:  Procedure Laterality Date   ABDOMINAL HYSTERECTOMY  2005   uterus and cervix   SALPINGOOPHORECTOMY Left 2004   Benign ovarian cyst   SALPINGOOPHORECTOMY Right 2014   benign complex R ovarian cyst, irregular bleeding, pelvic pain, adhesions    Social History   Tobacco Use   Smoking status: Every Day    Current packs/day: 1.00    Average packs/day: 1 pack/day for 43.0 years (43.0 ttl pk-yrs)    Types: Cigarettes   Smokeless tobacco: Never  Vaping Use   Vaping status: Never Used  Substance Use Topics   Alcohol use: Not Currently   Drug use: Never    Family History  Problem Relation Age of Onset   Hypertension Mother    Hypertension Father    Cancer Maternal Grandmother         04/12/2023    1:44 PM 10/18/2022    1:35 PM 09/13/2022    3:54 PM 08/14/2022    4:00 PM  GAD 7 : Generalized Anxiety Score  Nervous, Anxious, on Edge 1 1 2 2   Control/stop worrying 2 1 1 1   Worry too much - different things 2 1 1 1   Trouble relaxing 2 1 0 0  Restless 2 1 0 0  Easily annoyed or irritable 2 1 2 2   Afraid - awful might happen 2 1 0 1  Total GAD 7 Score 13 7 6 7   Anxiety Difficulty Somewhat difficult Not difficult at all Not difficult at all Not difficult at all       04/12/2023    1:44 PM 10/18/2022    1:34 PM 09/13/2022    3:53 PM  Depression screen PHQ 2/9  Decreased Interest 1 2 2   Down, Depressed, Hopeless 0 1 2  PHQ - 2 Score 1 3 4   Altered sleeping  3 2  Tired, decreased energy  2 1  Change in appetite  2 0  Feeling bad or failure about yourself   0 0  Trouble concentrating  1 0  Moving slowly or fidgety/restless  2 0  Suicidal thoughts  0 0  PHQ-9 Score  13 7  Difficult doing work/chores  Somewhat difficult Not difficult  at all    BP Readings from Last 3 Encounters:  05/01/23 112/78  04/12/23 110/76  10/18/22 102/70    Wt Readings from Last 3 Encounters:  05/01/23 145 lb (65.8 kg)  04/12/23 141 lb (64 kg)  10/18/22 185 lb (83.9 kg)    BP 112/78   Pulse 87   Temp 98.4 F (36.9 C)   Ht 5\' 7"  (1.702 m)   Wt 145 lb (65.8 kg)   SpO2 96%   BMI 22.71 kg/m   Physical Exam Vitals and nursing note reviewed.  Constitutional:      Appearance: Normal appearance.  Cardiovascular:     Rate and Rhythm: Normal rate.  Pulmonary:     Effort: Pulmonary effort is normal.  Abdominal:     General: There is no distension.  Musculoskeletal:        General: Normal range of motion.  Skin:    General: Skin is warm and dry.  Neurological:     Mental Status: She is alert and oriented to person, place, and time.     Gait: Gait is intact.  Psychiatric:        Mood and Affect: Mood and affect normal.     Recent Labs     Component Value Date/Time   NA 143 04/12/2023 1456   NA 138 05/08/2011 1833   K 3.8 04/12/2023 1456   K 4.6 05/08/2011 1833   CL 106 04/12/2023 1456   CL 102 05/08/2011 1833   CO2 21 04/12/2023 1456   CO2 31 05/08/2011 1833   GLUCOSE 106 (H) 04/12/2023 1456   GLUCOSE 97 05/08/2011 1833   BUN 17 04/12/2023 1456   BUN 18 05/08/2011 1833   CREATININE 0.95 04/12/2023 1456   CREATININE 1.02 05/08/2011 1833   CALCIUM 9.7 04/12/2023 1456   CALCIUM 8.8 05/08/2011 1833   PROT 7.6 04/12/2023 1456   PROT 7.5 05/08/2011 1833   ALBUMIN 4.0 04/12/2023 1456   ALBUMIN 3.4 05/08/2011 1833   AST 16 04/12/2023 1456   AST 31 05/08/2011 1833   ALT 10 04/12/2023 1456   ALT 47 05/08/2011 1833   ALKPHOS 96 04/12/2023 1456   ALKPHOS 108 05/08/2011 1833   BILITOT 0.3 04/12/2023 1456   BILITOT 0.3 05/08/2011 1833   GFRNONAA >60 05/08/2011 1833   GFRAA >60 05/08/2011 1833    Lab Results  Component Value Date   WBC 5.3 04/17/2023   HGB 11.2 04/17/2023   HCT 34.2 04/17/2023   MCV 99 (H)  04/17/2023   PLT 79 (LL) 04/17/2023   No results found for: "HGBA1C" Lab Results  Component Value Date   CHOL 182 07/10/2022   HDL 74 07/10/2022   LDLCALC 95 07/10/2022   TRIG 70 07/10/2022   CHOLHDL 2.5 07/10/2022   Lab Results  Component Value Date   TSH 1.190 07/10/2022     Assessment and Plan:  1. Thrombocytopenia (HCC) (Primary) Repeat CBC with differential today. - CBC with Differential/Platelet   Follow-up TBD pending lab results and specialist evaluation.   Alvester Morin, PA-C, DMSc, Nutritionist [facility] Primary Care and Sports Medicine [facility] [phone]

## 2023-05-01 NOTE — Telephone Encounter (Signed)
.  Lung Cancer Screening Narrative/Criteria Questionnaire (Cigarette Smokers Only- No Cigars/Pipes/vapes)   Natalie Hansen   SDMV:05/10/2023 at 12:45 pm with Orpha Bur       Jun 24, 1970   LDCT: 05/18/2023 at 4:30 pm at [facility]    52 y.o.   Phone: [phone]  Lung Screening Narrative (confirm age 60-77 yrs Medicare / 50-80 yrs Private pay insurance)   Insurance information:Medicaid   Referring Provider: Mordecai Maes, PA   This screening involves an initial phone call with a team member from our program. It is called a shared decision making visit. The initial meeting is required by  insurance and Medicare to make sure you understand the program. This appointment takes about 15-20 minutes to complete. You will complete the screening scan at your scheduled date/time.  This scan takes about 5-10 minutes to complete. You can eat and drink normally before and after the scan.  Criteria questions for Lung Cancer Screening:   Are you a current or former smoker? Current Age began smoking: 8   If you are a former smoker, what year did you quit smoking?  (within 15 yrs)   To calculate your smoking history, I need an accurate estimate of how many packs of cigarettes you smoked per day and for how many years. (Not just the number of PPD you are now smoking)   Years smoking 44 x Packs per day 1.5 = Pack years 66   (at least 20 pack yrs)   (Make sure they understand that we need to know how much they have smoked in the past, not just the number of PPD they are smoking now)  Do you have a personal history of cancer?  No    Do you have a family history of cancer? Yes  (cancer type and and relative) grandmother unknown type  Are you coughing up blood?  No  Have you had unexplained weight loss of 15 lbs or more in the last 6 months? Yes-mental health admission, she was not eating.   It looks like you meet all criteria.  When would be a good time for [place] to schedule you for this screening?   Additional  information: N/A

## 2023-05-02 LAB — CBC WITH DIFFERENTIAL/PLATELET
Basophils Absolute: 0 10*3/uL (ref 0.0–0.2)
Basos: 1 %
EOS (ABSOLUTE): 0.1 10*3/uL (ref 0.0–0.4)
Eos: 2 %
Hematocrit: 39.8 % (ref 34.0–46.6)
Hemoglobin: 13 g/dL (ref 11.1–15.9)
Immature Grans (Abs): 0 10*3/uL (ref 0.0–0.1)
Immature Granulocytes: 0 %
Lymphocytes Absolute: 1.5 10*3/uL (ref 0.7–3.1)
Lymphs: 30 %
MCH: 32 pg (ref 26.6–33.0)
MCHC: 32.7 g/dL (ref 31.5–35.7)
MCV: 98 fL — ABNORMAL HIGH (ref 79–97)
Monocytes Absolute: 0.3 10*3/uL (ref 0.1–0.9)
Monocytes: 6 %
Neutrophils Absolute: 3 10*3/uL (ref 1.4–7.0)
Neutrophils: 61 %
Platelets: 270 10*3/uL (ref 150–450)
RBC: 4.06 x10E6/uL (ref 3.77–5.28)
RDW: 11.6 % — ABNORMAL LOW (ref 11.7–15.4)
WBC: 4.9 10*3/uL (ref 3.4–10.8)

## 2023-05-10 ENCOUNTER — Encounter: Payer: Self-pay | Admitting: Adult Health

## 2023-05-10 ENCOUNTER — Ambulatory Visit: Admitting: Adult Health

## 2023-05-10 DIAGNOSIS — F17210 Nicotine dependence, cigarettes, uncomplicated: Secondary | ICD-10-CM

## 2023-05-10 NOTE — Patient Instructions (Signed)

## 2023-05-10 NOTE — Progress Notes (Signed)
  Virtual Visit via Telephone Note  I connected with Natalie Hansen , 05/10/23 12:47 PM by a telemedicine application and verified that I am speaking with the correct person using two identifiers.  Location: Patient: home Provider: home   I discussed the limitations of evaluation and management by telemedicine and the availability of in person appointments. The patient expressed understanding and agreed to proceed.   Shared Decision Making Visit Lung Cancer Screening Program [phone])   Eligibility: 53 y.o. Pack Years Smoking History Calculation = 66 pack years  (# packs/per year x # years smoked) Recent History of coughing up blood  no Unexplained weight loss? no ( >Than 15 pounds within the last 6 months ) Prior History Lung / other cancer no (Diagnosis within the last 5 years already requiring surveillance chest CT Scans). Smoking Status Current Smoker  Visit Components: Discussion included one or more decision making aids. YES Discussion included risk/benefits of screening. YES Discussion included potential follow up diagnostic testing for abnormal scans. YES Discussion included meaning and risk of over diagnosis. YES Discussion included meaning and risk of False Positives. YES Discussion included meaning of total radiation exposure. YES  Counseling Included: Importance of adherence to annual lung cancer LDCT screening. YES Impact of comorbidities on ability to participate in the program. YES Ability and willingness to under diagnostic treatment. YES  Smoking Cessation Counseling: Current Smokers:  Discussed importance of smoking cessation. yes Information about tobacco cessation classes and interventions provided to patient. yes Patient provided with "ticket" for LDCT Scan. yes Symptomatic Patient. NO Diagnosis Code: Tobacco Use Z72.0 Asymptomatic Patient yes  Counseling (Intermediate counseling: > three minutes counseling) U7253  (CT Chest Lung Cancer Screening  Low Dose W/O CM) GUY4034  Z12.2-Screening of respiratory organs Z87.891-Personal history of nicotine dependence   Danford Bad 05/10/23

## 2023-05-18 ENCOUNTER — Ambulatory Visit: Admission: RE | Admit: 2023-05-18 | Source: Ambulatory Visit

## 2023-06-06 ENCOUNTER — Ambulatory Visit
Admission: RE | Admit: 2023-06-06 | Discharge: 2023-06-06 | Disposition: A | Discharge status: Home / Self Care | Source: Ambulatory Visit | Attending: Acute Care | Admitting: Acute Care

## 2023-06-06 DIAGNOSIS — Z122 Encounter for screening for malignant neoplasm of respiratory organs: Secondary | ICD-10-CM | POA: Insufficient documentation

## 2023-06-06 DIAGNOSIS — F17210 Nicotine dependence, cigarettes, uncomplicated: Secondary | ICD-10-CM | POA: Insufficient documentation

## 2023-06-06 DIAGNOSIS — Z87891 Personal history of nicotine dependence: Secondary | ICD-10-CM | POA: Insufficient documentation

## 2023-07-09 ENCOUNTER — Other Ambulatory Visit: Payer: Self-pay

## 2023-07-09 DIAGNOSIS — Z122 Encounter for screening for malignant neoplasm of respiratory organs: Secondary | ICD-10-CM

## 2023-07-09 DIAGNOSIS — F17210 Nicotine dependence, cigarettes, uncomplicated: Secondary | ICD-10-CM

## 2023-07-09 DIAGNOSIS — Z87891 Personal history of nicotine dependence: Secondary | ICD-10-CM

## 2023-07-14 ENCOUNTER — Other Ambulatory Visit: Payer: Self-pay | Admitting: Physician Assistant

## 2023-07-14 DIAGNOSIS — J449 Chronic obstructive pulmonary disease, unspecified: Secondary | ICD-10-CM

## 2023-07-17 NOTE — Telephone Encounter (Signed)
 Requested Prescriptions  Pending Prescriptions Disp Refills   SYMBICORT  160-4.5 MCG/ACT inhaler [Pharmacy Med Name: SYMBICORT  160/4. (120 ORAL INH)] 10.2 g 5    Sig: INHALE 2 PUFFS INTO THE LUNGS IN THE MORNING AND 2 PUFFS AT BEDTIME FOR COPD MAINTENANCE THERAPY. RINSE MOUTH AFTER USE.     Pulmonology:  Combination Products Passed - 07/17/2023 12:08 PM      Passed - Valid encounter within last 12 months    Recent Outpatient Visits           2 months ago Thrombocytopenia [facility])   [facility] Primary Care & Sports Medicine at [facility], Arleen Lacer, [place]   3 months ago Moderate protein-calorie malnutrition [facility])   [facility] Primary Care & Sports Medicine at [facility], Arleen Lacer, [place]

## 2023-08-01 ENCOUNTER — Telehealth: Payer: Self-pay

## 2023-08-01 NOTE — Telephone Encounter (Signed)
 Viewed pts chart does not look like anyone has called the pt.  KP  Copied from CRM [id number]. Topic: General - Call Back - No Documentation >> Aug 01, 2023 12:37 PM Natalie Hansen wrote: Reason for CRM: patient missed a call from office please call back [phone]

## 2023-08-08 ENCOUNTER — Encounter: Payer: Self-pay | Admitting: Physician Assistant

## 2023-08-08 ENCOUNTER — Other Ambulatory Visit: Payer: Self-pay | Admitting: Physician Assistant

## 2023-08-08 ENCOUNTER — Ambulatory Visit (INDEPENDENT_AMBULATORY_CARE_PROVIDER_SITE_OTHER): Admitting: Physician Assistant

## 2023-08-08 VITALS — BP 118/76 | HR 72 | Temp 98.7°F | Ht 67.0 in | Wt 159.0 lb

## 2023-08-08 DIAGNOSIS — Z1231 Encounter for screening mammogram for malignant neoplasm of breast: Secondary | ICD-10-CM

## 2023-08-08 DIAGNOSIS — Z780 Asymptomatic menopausal state: Secondary | ICD-10-CM

## 2023-08-08 DIAGNOSIS — J301 Allergic rhinitis due to pollen: Secondary | ICD-10-CM

## 2023-08-08 DIAGNOSIS — K625 Hemorrhage of anus and rectum: Secondary | ICD-10-CM | POA: Insufficient documentation

## 2023-08-08 DIAGNOSIS — I2510 Atherosclerotic heart disease of native coronary artery without angina pectoris: Secondary | ICD-10-CM | POA: Diagnosis not present

## 2023-08-08 DIAGNOSIS — J439 Emphysema, unspecified: Secondary | ICD-10-CM | POA: Diagnosis not present

## 2023-08-08 DIAGNOSIS — F17200 Nicotine dependence, unspecified, uncomplicated: Secondary | ICD-10-CM | POA: Diagnosis not present

## 2023-08-08 DIAGNOSIS — L723 Sebaceous cyst: Secondary | ICD-10-CM

## 2023-08-08 DIAGNOSIS — R937 Abnormal findings on diagnostic imaging of other parts of musculoskeletal system: Secondary | ICD-10-CM

## 2023-08-08 DIAGNOSIS — I700 Atherosclerosis of aorta: Secondary | ICD-10-CM | POA: Insufficient documentation

## 2023-08-08 DIAGNOSIS — M25522 Pain in left elbow: Secondary | ICD-10-CM

## 2023-08-08 MED ORDER — ROSUVASTATIN CALCIUM 5 MG PO TABS: 5.0000 mg | ORAL_TABLET | Freq: Every day | ORAL | 1 refills | Status: DC

## 2023-08-08 MED ORDER — MONTELUKAST SODIUM 10 MG PO TABS
10.0000 mg | ORAL_TABLET | Freq: Every day | ORAL | 1 refills | Status: DC
Start: 2023-08-08 — End: 2023-09-25

## 2023-08-08 NOTE — Progress Notes (Signed)
 Date:  08/08/2023   Name:  Natalie Hansen   DOB:  [birth year]   MRN:  [id number]   Chief Complaint: Results (Lung cancer screening results), Cyst (X longer than 2-3 years,On back getting bigger, popped it with a pin, drained when popped, painful), and Skin Problem (Right breast, childhood, mole appeared, brown in color, not painful )  HPI Natalie Hansen presents today joined by her partner, primarily here to discuss results from LDCT performed last month.  The scan was negative for concerning masses or nodules, but showed emphysema as well as atherosclerosis of the left circumflex and thoracic aorta.  She says her breathing has been worse despite the use of Symbicort  and albuterol .  She has not returned to pulmonology since her CT was performed.  Additionally she has a sebaceous cyst of the left upper back that she popped at home resulting in a combination of both thick and thin purulent discharge which was manually expressed by patient.  She also would like me to look at a skin lesion on the lateral aspect of the right breast, says this is a birthmark, but recently has developed a skin tag.  Reports continued intermittent rectal bleeding, still has not completed colonoscopy and as a result the referral from over a year ago has now expired.  Similarly, her mammogram order from over a year ago has also expired.  Complains of left elbow pain which we have previously discussed.  X-ray 1 year ago mostly normal although she has had prior surgery of the left elbow.  That x-ray also mentioned decreased bone mineralization as an incidental finding.  Patient has never had a DEXA.   Medication list has been reviewed and updated.  Current Meds  Medication Sig   albuterol  (PROVENTIL) (2.5 MG/3ML) 0.083% nebulizer solution Inhale into the lungs.   chlordiazePOXIDE (LIBRIUM) 25 MG capsule Take 50 mg by mouth at bedtime as needed for anxiety.   FOLIC ACID PO Take by mouth.   lurasidone (LATUDA) 40 MG TABS  tablet Take 40 mg by mouth at bedtime.   montelukast  (SINGULAIR ) 10 MG tablet Take 1 tablet (10 mg total) by mouth at bedtime.   Multiple Vitamins-Minerals (CENTRUM SILVER PO) Take by mouth.   omeprazole  (PRILOSEC) 20 MG capsule Take 1 capsule (20 mg total) by mouth daily.   promethazine  (PHENERGAN ) 25 MG tablet Take 1 tablet (25 mg total) by mouth every 8 (eight) hours as needed for nausea or vomiting.   rosuvastatin (CRESTOR) 5 MG tablet Take 1 tablet (5 mg total) by mouth daily.   SYMBICORT  160-4.5 MCG/ACT inhaler INHALE 2 PUFFS INTO THE LUNGS IN THE MORNING AND 2 PUFFS AT BEDTIME FOR COPD MAINTENANCE THERAPY. RINSE MOUTH AFTER USE.   Thiamine HCl (VITAMIN B1 PO) Take by mouth.   traZODone  (DESYREL ) 150 MG tablet Take 1 tablet (150 mg total) by mouth at bedtime.   VENTOLIN  HFA 108 (90 Base) MCG/ACT inhaler Inhale 2 puffs into the lungs every 4 (four) hours as needed for wheezing or shortness of breath.     Review of Systems  Patient Active Problem List   Diagnosis Date Noted   Thoracic aortic atherosclerosis (HCC) 08/08/2023   Atherosclerosis of native coronary artery of native heart without angina pectoris 08/08/2023   Rectal bleeding 08/08/2023   Thrombocytopenia (HCC) 04/27/2023   Noncompliance 04/12/2023   Moderate protein-calorie malnutrition (HCC) 04/12/2023   Chronic diarrhea 10/18/2022   History of intravenous drug use 07/10/2022   Osteoarthritis of left glenohumeral joint 06/27/2022  Rotator cuff dysfunction, left 06/18/2022   Left cervical radiculopathy 06/18/2022   History of hepatitis 06/12/2022   Chronic anemia 06/12/2022   Allergic rhinitis 06/12/2022   Hypertension 06/12/2022   PTSD (post-traumatic stress disorder) 07/02/2021   Anxiety 07/02/2021   Cocaine abuse in remission (HCC) 07/02/2021   ETOH abuse 07/02/2021   At risk for long QT syndrome 07/19/2017   Emphysema/COPD (HCC) 05/26/2011   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/2011   History of suicide attempt  05/26/2011   Gastritis and gastroduodenitis 07/28/2010   Tobacco use disorder 07/28/2010   Depressive disorder 07/28/2010   Hepatitis C virus infection cured after antiviral drug therapy 07/28/2010    Allergies  Allergen Reactions   Prednisone Nausea Only    Severe nausea and vomiting   Tramadol Other (See Comments)    I about went crazy it didn't adjust with my system   Voltaren  [Diclofenac ] Nausea And Vomiting    Immunization History  Administered Date(s) Administered   Dtap, Unspecified 01/16/1972, 03/12/1972, 05/08/1972, 05/13/1973   Hepb-cpg 11/08/2017   Influenza, Quadrivalent, Recombinant, Inj, Pf 03/30/2020   Influenza,inj,Quad PF,6+ Mos 12/14/2014   Influenza-Unspecified 01/18/2009   Measles 01/16/1972   Moderna Sars-Covid-2 Vaccination 06/12/2019, 07/11/2019, 03/30/2020   Mumps 10/17/1976   Pneumococcal Polysaccharide-23 12/14/2014   Polio, Unspecified 01/16/1972, 03/12/1972, 05/08/1972, 05/13/1973   Rubella 01/16/1972   Tdap 07/02/2021   Zoster Recombinant(Shingrix ) 07/10/2022, 10/18/2022    Past Surgical History:  Procedure Laterality Date   ABDOMINAL HYSTERECTOMY  2005   uterus and cervix   SALPINGOOPHORECTOMY Left 2004   Benign ovarian cyst   SALPINGOOPHORECTOMY Right 2014   benign complex R ovarian cyst, irregular bleeding, pelvic pain, adhesions    Social History   Tobacco Use   Smoking status: Every Day    Current packs/day: 1.00    Average packs/day: 1 pack/day for 43.0 years (43.0 ttl pk-yrs)    Types: Cigarettes   Smokeless tobacco: Never  Vaping Use   Vaping status: Never Used  Substance Use Topics   Alcohol use: Not Currently   Drug use: Never    Family History  Problem Relation Age of Onset   Hypertension Mother    Hypertension Father    Cancer Maternal Grandmother         08/08/2023    2:43 PM 04/12/2023    1:44 PM 10/18/2022    1:35 PM 09/13/2022    3:54 PM  GAD 7 : Generalized Anxiety Score  Nervous, Anxious, on Edge 2  1 1 2   Control/stop worrying 1 2 1 1   Worry too much - different things 1 2 1 1   Trouble relaxing 1 2 1  0  Restless 1 2 1  0  Easily annoyed or irritable 0 2 1 2   Afraid - awful might happen 0 2 1 0  Total GAD 7 Score 6 13 7 6   Anxiety Difficulty Somewhat difficult Somewhat difficult Not difficult at all Not difficult at all       08/08/2023    2:43 PM 04/12/2023    1:44 PM 10/18/2022    1:34 PM  Depression screen PHQ 2/9  Decreased Interest 1 1 2   Down, Depressed, Hopeless 0 0 1  PHQ - 2 Score 1 1 3   Altered sleeping 1  3  Tired, decreased energy 1  2  Change in appetite 2  2  Feeling bad or failure about yourself  0  0  Trouble concentrating 1  1  Moving slowly or fidgety/restless 1  2  Suicidal thoughts 0  0  PHQ-9 Score 7  13  Difficult doing work/chores Not difficult at all  Somewhat difficult    BP Readings from Last 3 Encounters:  08/08/23 118/76  05/01/23 112/78  04/12/23 110/76    Wt Readings from Last 3 Encounters:  08/08/23 159 lb (72.1 kg)  05/01/23 145 lb (65.8 kg)  04/12/23 141 lb (64 kg)    BP 118/76   Pulse 72   Temp 98.7 F (37.1 C)   Ht 5' 7 (1.702 m)   Wt 159 lb (72.1 kg)   SpO2 96%   BMI 24.90 kg/m   Physical Exam Vitals and nursing note reviewed.  Constitutional:      Appearance: Normal appearance.  Cardiovascular:     Rate and Rhythm: Normal rate.  Pulmonary:     Effort: Pulmonary effort is normal.     Breath sounds: Wheezing and rhonchi present.  Abdominal:     General: There is no distension.  Musculoskeletal:        General: Normal range of motion.  Skin:    General: Skin is warm and dry.     Comments: 5 mm skin tag of the lateral right breast on a light brown base. Evidence of an expressed sebaceous cyst left upper back has been punctured and is not actively draining or tender.  Neurological:     Mental Status: She is alert and oriented to person, place, and time.     Gait: Gait is intact.  Psychiatric:        Mood and  Affect: Mood and affect normal.     Recent Labs     Component Value Date/Time   NA 143 04/12/2023 1456   NA 138 05/08/2011 1833   K 3.8 04/12/2023 1456   K 4.6 05/08/2011 1833   CL 106 04/12/2023 1456   CL 102 05/08/2011 1833   CO2 21 04/12/2023 1456   CO2 31 05/08/2011 1833   GLUCOSE 106 (H) 04/12/2023 1456   GLUCOSE 97 05/08/2011 1833   BUN 17 04/12/2023 1456   BUN 18 05/08/2011 1833   CREATININE 0.95 04/12/2023 1456   CREATININE 1.02 05/08/2011 1833   CALCIUM 9.7 04/12/2023 1456   CALCIUM 8.8 05/08/2011 1833   PROT 7.6 04/12/2023 1456   PROT 7.5 05/08/2011 1833   ALBUMIN 4.0 04/12/2023 1456   ALBUMIN 3.4 05/08/2011 1833   AST 16 04/12/2023 1456   AST 31 05/08/2011 1833   ALT 10 04/12/2023 1456   ALT 47 05/08/2011 1833   ALKPHOS 96 04/12/2023 1456   ALKPHOS 108 05/08/2011 1833   BILITOT 0.3 04/12/2023 1456   BILITOT 0.3 05/08/2011 1833   GFRNONAA >60 05/08/2011 1833   GFRAA >60 05/08/2011 1833    Lab Results  Component Value Date   WBC 4.9 05/01/2023   HGB 13.0 05/01/2023   HCT 39.8 05/01/2023   MCV 98 (H) 05/01/2023   PLT 270 05/01/2023   No results found for: HGBA1C Lab Results  Component Value Date   CHOL 182 07/10/2022   HDL 74 07/10/2022   LDLCALC 95 07/10/2022   TRIG 70 07/10/2022   CHOLHDL 2.5 07/10/2022   Lab Results  Component Value Date   TSH 1.190 07/10/2022     Assessment and Plan:  1. Pulmonary emphysema, unspecified emphysema type (HCC) (Primary) Carefully reviewed the results of the patient's recent CT in the room with images.  Discussed evidence of emphysema which will only get worse with ongoing tobacco use.  Presently  on Symbicort  and albuterol , advised patient to follow-up with pulmonology if she thinks additional inhalers are necessary.  - Ambulatory referral to Virtual Care Smoking Cessation  2. Tobacco use disorder Patient willing to consider smoking cessation, though she is somewhat reluctant.  She does not want to have  her first consult for this until July, says her calendar for June is full. - Ambulatory referral to Virtual Care Smoking Cessation  3. Atherosclerosis of native coronary artery of native heart without angina pectoris Discussed plaque as visualized on recent CT.  Last lipids were actually within normal limits a year ago.  Regardless of lipid status, she should begin statin therapy to lower her ASCVD risk with known atherosclerosis and ongoing tobacco use.  She is agreeable to trial of rosuvastatin as below  - rosuvastatin (CRESTOR) 5 MG tablet; Take 1 tablet (5 mg total) by mouth daily.  Dispense: 30 tablet; Refill: 1  4. Thoracic aortic atherosclerosis (HCC) - rosuvastatin (CRESTOR) 5 MG tablet; Take 1 tablet (5 mg total) by mouth daily.  Dispense: 30 tablet; Refill: 1  5. Rectal bleeding Emphasized importance of colonoscopy.  Submitting another referral today as her previous one expired. - Ambulatory referral to Gastroenterology  6. Encounter for screening mammogram for breast cancer Ordering another mammogram, never completed last year - MM 3D SCREENING MAMMOGRAM BILATERAL BREAST  7. Non-seasonal allergic rhinitis due to pollen Will try adding Singulair  to her medication regimen which may help with both her allergies and lung issues. - montelukast  (SINGULAIR ) 10 MG tablet; Take 1 tablet (10 mg total) by mouth at bedtime.  Dispense: 30 tablet; Refill: 1  8. Abnormal x-ray of bone Check DEXA - DG Bone Density  9. Postmenopausal state - DG Bone Density  10. Sebaceous cyst Patient advised not particular concerning at this time.  Could refer to dermatology for definitive management at any time if she would like, but she would like to defer today.  Same goes for the mole/skin tag on the right breast.  11. Left elbow pain Patient reminded that she can see our sports medicine provider Dr. Augustus Ledger anytime for this problem, as she is already established with him.     Return in about 4  weeks (around 09/05/2023) for OV f/u chronic conditions.   Today's visit billed for provider time of 51 minutes inclusive of thorough chart review, patient education, interpretation of external imaging, specialist referral, ordering of diagnostics, physical exam, and medication management.  Cody Das, PA-C, DMSc, Nutritionist [facility] Primary Care and Sports [facility] [phone]

## 2023-08-10 NOTE — Telephone Encounter (Signed)
 Both meds reordered 08/08/23 #30 1 RF  Requested Prescriptions  Refused Prescriptions Disp Refills   montelukast  (SINGULAIR ) 10 MG tablet [Pharmacy Med Name: MONTELUKAST  10MG  TABLETS] 90 tablet     Sig: TAKE 1 TABLET(10 MG) BY MOUTH AT BEDTIME     Pulmonology:  Leukotriene Inhibitors Passed - 08/10/2023 10:57 AM      Passed - Valid encounter within last 12 months    Recent Outpatient Visits           2 days ago Pulmonary emphysema, unspecified emphysema type (HCC)   [place] Primary Care & Sports Medicine at [facility], Arleen Lacer, [place]   3 months ago Thrombocytopenia [facility])   [facility] Primary Care & [facility] Medicine at [facility], [place]leen Lacer, [place]   4 months ago Moderate protein-calorie malnutrition [facility])   [place] Primary Care & Sports Medicine at [facility], Arleen Lacer, [place]       Future Appointments             In 3 weeks Larkin Plumb, Arleen Lacer, PA [facility] Primary Care & Sports Medicine at [facility]             rosuvastatin  (CRESTOR ) 5 MG tablet [Pharmacy Med Name: ROSUVASTATIN  5MG  TABLETS] 90 tablet     Sig: TAKE 1 TABLET(5 MG) BY MOUTH DAILY     Cardiovascular:  Antilipid - Statins 2 Failed - 08/10/2023 10:57 AM      Failed - Lipid Panel in normal range within the last 12 months    Cholesterol, Total  Date Value Ref Range Status  07/10/2022 182 100 - 199 mg/dL Final   LDL Chol Calc (NIH)  Date Value Ref Range Status  07/10/2022 95 0 - 99 mg/dL Final   HDL  Date Value Ref Range Status  07/10/2022 74 >39 mg/dL Final   Triglycerides  Date Value Ref Range Status  07/10/2022 70 0 - 149 mg/dL Final         Passed - Cr in normal range and within 360 days    Creatinine  Date Value Ref Range Status  05/08/2011 1.02 0.60 - 1.30 mg/dL Final   Creatinine, Ser  Date Value Ref Range Status  04/12/2023 0.95 0.57 - 1.00 mg/dL Final         Passed - Patient is not pregnant      Passed - Valid encounter within last  12 months    Recent Outpatient Visits           2 days ago Pulmonary emphysema, unspecified emphysema type (HCC)   [place] [facility] Care & Sports Medicine at [facility], Arleen Lacer, [place]   3 months ago Thrombocytopenia [facility])   [facility] Care [facility] Sports Medicine at [facility], Arleen Lacer, [place]   4 months ago Moderate protein-calorie malnutrition [facility])   [facility] Primary Care & Sports Medicine at [facility], Arleen Lacer, [place]       Future Appointments             In 3 weeks Larkin Plumb, Arleen Lacer, PA [facility] Primary Care & Sports Medicine at [facility], [facility]

## 2023-08-13 ENCOUNTER — Other Ambulatory Visit: Payer: Self-pay

## 2023-08-13 ENCOUNTER — Telehealth: Payer: Self-pay | Admitting: Physician Assistant

## 2023-08-13 DIAGNOSIS — F489 Nonpsychotic mental disorder, unspecified: Secondary | ICD-10-CM

## 2023-08-13 DIAGNOSIS — F419 Anxiety disorder, unspecified: Secondary | ICD-10-CM

## 2023-08-13 DIAGNOSIS — F4310 Post-traumatic stress disorder, unspecified: Secondary | ICD-10-CM

## 2023-08-13 DIAGNOSIS — F32A Depression, unspecified: Secondary | ICD-10-CM

## 2023-08-13 NOTE — Telephone Encounter (Signed)
Can referral be placed?  KP

## 2023-08-13 NOTE — Telephone Encounter (Signed)
 Called pt told her to call back on Monday if no one has called her to schedule an appointment. At that time we will see if we can get her scheduled.  KP

## 2023-08-13 NOTE — Telephone Encounter (Signed)
 Copied from CRM [id number]. Topic: Referral - Question >> Aug 13, 2023  1:13 PM Hamp Levine R wrote: Reason for CRM: Patients current [facility] Specialist is no longer with the practice and they have no other providers that she is able to see. Patient is requesting to have PA Waddell call her back to provide a reference of who she should see. Is concerned that she only has 30 days worth of medication and she wants to make sure she is able to see someone new prior to them.   Patient can be reached at [phone]

## 2023-08-21 ENCOUNTER — Telehealth: Payer: Self-pay | Admitting: Physician Assistant

## 2023-08-21 NOTE — Telephone Encounter (Signed)
 Copied from CRM [id number]. Topic: Referral - Status >> Aug 21, 2023  2:00 PM Elle L wrote: Reason for CRM: The patient was calling for an update on her behavioral health referral per her chart notes from 6/16. However, I did not see where the referral had been placed at this time. Her call back number is [phone].

## 2023-08-21 NOTE — Telephone Encounter (Signed)
Reached out to referrals team.  KP

## 2023-08-21 NOTE — Telephone Encounter (Signed)
 Referrals team reached out to location. She had to leave a message.  KP

## 2023-09-04 ENCOUNTER — Ambulatory Visit: Payer: Self-pay | Admitting: Psychiatry

## 2023-09-06 ENCOUNTER — Telehealth: Admitting: Physician Assistant

## 2023-09-06 ENCOUNTER — Encounter: Payer: Self-pay | Admitting: Physician Assistant

## 2023-09-06 ENCOUNTER — Telehealth: Payer: Self-pay | Admitting: Physician Assistant

## 2023-09-06 VITALS — Ht 67.0 in

## 2023-09-06 DIAGNOSIS — I2510 Atherosclerotic heart disease of native coronary artery without angina pectoris: Secondary | ICD-10-CM | POA: Diagnosis not present

## 2023-09-06 DIAGNOSIS — F17210 Nicotine dependence, cigarettes, uncomplicated: Secondary | ICD-10-CM

## 2023-09-06 DIAGNOSIS — Z1231 Encounter for screening mammogram for malignant neoplasm of breast: Secondary | ICD-10-CM

## 2023-09-06 DIAGNOSIS — J301 Allergic rhinitis due to pollen: Secondary | ICD-10-CM | POA: Diagnosis not present

## 2023-09-06 DIAGNOSIS — R937 Abnormal findings on diagnostic imaging of other parts of musculoskeletal system: Secondary | ICD-10-CM

## 2023-09-06 DIAGNOSIS — F32A Depression, unspecified: Secondary | ICD-10-CM

## 2023-09-06 DIAGNOSIS — K625 Hemorrhage of anus and rectum: Secondary | ICD-10-CM

## 2023-09-06 DIAGNOSIS — F17200 Nicotine dependence, unspecified, uncomplicated: Secondary | ICD-10-CM

## 2023-09-06 NOTE — Telephone Encounter (Signed)
 Copied from CRM [id number]. Topic: Appointments - Appointment Info/Confirmation >> Sep 06, 2023  3:03 PM Turkey B wrote: Pt called confirmed appt today, pt needs a phone call because she doesn't use mychart

## 2023-09-06 NOTE — Progress Notes (Signed)
 Date:  09/06/2023   Name:  Natalie Hansen   DOB:  [birth year]   MRN:  [id number]   I connected with Natalie Hansen on 09/06/23 via MyChart Video and verified that I am speaking with the correct person using appropriate identifiers. The limitations, risks, security and privacy concerns of performing an evaluation and management service by MyChart Video, including the higher likelihood of inaccurate diagnoses and treatments, and the availability of in person appointments were reviewed. The possible need of an additional face-to-face encounter for complete and high quality delivery of care was discussed. The patient was also made aware that there may be a patient responsible charge related to this service. The patient expressed understanding and wishes to proceed.  While use of video was attempted, due to technical difficulties this visit had to be converted to audio only   Provider location is in medical facility [facility] at [facility]). Patient location is at their home People involved in care of the patient during this telehealth encounter were myself, my CMA, and my front office/scheduling team member.    Chief Complaint: Medical Management of Chronic Issues  HPI [name] presents virtually today for 1 month follow-up on chronic conditions.  Last visit she told me her breathing has been worse despite the use of Symbicort  and albuterol , so we started montelukast  which she says has helped quite a bit.  She has not returned to pulmonology since her CT was performed.  She is scheduled to establish with behavioral health through [facility] on 09/12/23. Says she will be coming close to running out of her Librium and trazodone  but has enough to last until that appointment.   Reports continued intermittent rectal bleeding, has a consult with [facility] 09/19/23. She also has another appt with [facility] 11/12/23 but says she will probably cancel if all goes according to  plan with [facility].   Also at last visit I have reordered mammogram and DEXA, which has not yet been scheduled but she has the number and has been meaning to call.  Ready to quit smoking, referral submitted last time to smoking cessation program but she has not heard anything from them to schedule.   Medication list has been reviewed and updated.  Current Meds  Medication Sig   albuterol  (PROVENTIL) (2.5 MG/3ML) 0.083% nebulizer solution Inhale into the lungs.   chlordiazePOXIDE (LIBRIUM) 25 MG capsule Take 50 mg by mouth at bedtime as needed for anxiety.   FOLIC ACID PO Take by mouth.   montelukast  (SINGULAIR ) 10 MG tablet Take 1 tablet (10 mg total) by mouth at bedtime.   Multiple Vitamins-Minerals (CENTRUM SILVER PO) Take by mouth.   omeprazole  (PRILOSEC) 20 MG capsule Take 1 capsule (20 mg total) by mouth daily.   promethazine  (PHENERGAN ) 25 MG tablet Take 1 tablet (25 mg total) by mouth every 8 (eight) hours as needed for nausea or vomiting.   rosuvastatin  (CRESTOR ) 5 MG tablet Take 1 tablet (5 mg total) by mouth daily.   SYMBICORT  160-4.5 MCG/ACT inhaler INHALE 2 PUFFS INTO THE LUNGS IN THE MORNING AND 2 PUFFS AT BEDTIME FOR COPD MAINTENANCE THERAPY. RINSE MOUTH AFTER USE.   Thiamine HCl (VITAMIN B1 PO) Take by mouth.   traZODone  (DESYREL ) 150 MG tablet Take 1 tablet (150 mg total) by mouth at bedtime.   VENTOLIN  HFA 108 (90 Base) MCG/ACT inhaler Inhale 2 puffs into the lungs every 4 (four) hours as needed for wheezing or shortness of  breath.   [DISCONTINUED] lurasidone (LATUDA) 40 MG TABS tablet Take 40 mg by mouth at bedtime.     Review of Systems  Patient Active Problem List   Diagnosis Date Noted   Thoracic aortic atherosclerosis (HCC) 08/08/2023   Atherosclerosis of native coronary artery of native heart without angina pectoris 08/08/2023   Rectal bleeding 08/08/2023   Thrombocytopenia (HCC) 04/27/2023   Noncompliance 04/12/2023   Moderate protein-calorie malnutrition  (HCC) 04/12/2023   Chronic diarrhea 10/18/2022   History of intravenous drug use 07/10/2022   Osteoarthritis of left glenohumeral joint 06/27/2022   Rotator cuff dysfunction, left 06/18/2022   Left cervical radiculopathy 06/18/2022   History of hepatitis 06/12/2022   Chronic anemia 06/12/2022   Allergic rhinitis 06/12/2022   Hypertension 06/12/2022   PTSD (post-traumatic stress disorder) 07/02/2021   Anxiety 07/02/2021   Cocaine abuse in remission (HCC) 07/02/2021   ETOH abuse 07/02/2021   At risk for long QT syndrome 07/19/2017   Emphysema/COPD (HCC) 05/26/2011   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/2011   History of suicide attempt 05/26/2011   Gastritis and gastroduodenitis 07/28/2010   Tobacco use disorder 07/28/2010   Depressive disorder 07/28/2010   Hepatitis C virus infection cured after antiviral drug therapy 07/28/2010    Allergies  Allergen Reactions   Prednisone Nausea Only    Severe nausea and vomiting   Tramadol Other (See Comments)    I about went crazy it didn't adjust with my system   Voltaren  [Diclofenac ] Nausea And Vomiting    Immunization History  Administered Date(s) Administered   Dtap, Unspecified 01/16/1972, 03/12/1972, 05/08/1972, 05/13/1973   Hepb-cpg 11/08/2017   Influenza, Quadrivalent, Recombinant, Inj, Pf 03/30/2020   Influenza,inj,Quad PF,6+ Mos 12/14/2014   Influenza-Unspecified 01/18/2009   Measles 01/16/1972   Moderna Sars-Covid-2 Vaccination 06/12/2019, 07/11/2019, 03/30/2020   Mumps 10/17/1976   Pneumococcal Polysaccharide-23 12/14/2014   Polio, Unspecified 01/16/1972, 03/12/1972, 05/08/1972, 05/13/1973   Rubella 01/16/1972   Tdap 07/02/2021   Zoster Recombinant(Shingrix ) 07/10/2022, 10/18/2022    Past Surgical History:  Procedure Laterality Date   ABDOMINAL HYSTERECTOMY  2005   uterus and cervix   SALPINGOOPHORECTOMY Left 2004   Benign ovarian cyst   SALPINGOOPHORECTOMY Right 2014   benign complex R ovarian cyst, irregular  bleeding, pelvic pain, adhesions    Social History   Tobacco Use   Smoking status: Every Day    Current packs/day: 1.00    Average packs/day: 1 pack/day for 43.0 years (43.0 ttl pk-yrs)    Types: Cigarettes   Smokeless tobacco: Never  Vaping Use   Vaping status: Never Used  Substance Use Topics   Alcohol use: Not Currently   Drug use: Never    Family History  Problem Relation Age of Onset   Hypertension Mother    Hypertension Father    Cancer Maternal Grandmother         09/06/2023    3:16 PM 08/08/2023    2:43 PM 04/12/2023    1:44 PM 10/18/2022    1:35 PM  GAD 7 : Generalized Anxiety Score  Nervous, Anxious, on Edge 2 2 1 1   Control/stop worrying 1 1 2 1   Worry too much - different things 1 1 2 1   Trouble relaxing 1 1 2 1   Restless 1 1 2 1   Easily annoyed or irritable 0 0 2 1  Afraid - awful might happen 0 0 2 1  Total GAD 7 Score 6 6 13 7   Anxiety Difficulty Somewhat difficult Somewhat difficult Somewhat difficult Not difficult at  all       09/06/2023    3:16 PM 08/08/2023    2:43 PM 04/12/2023    1:44 PM  Depression screen PHQ 2/9  Decreased Interest 0 1 1  Down, Depressed, Hopeless 0 0 0  PHQ - 2 Score 0 1 1  Altered sleeping  1   Tired, decreased energy  1   Change in appetite  2   Feeling bad or failure about yourself   0   Trouble concentrating  1   Moving slowly or fidgety/restless  1   Suicidal thoughts  0   PHQ-9 Score  7   Difficult doing work/chores  Not difficult at all     BP Readings from Last 3 Encounters:  08/08/23 118/76  05/01/23 112/78  04/12/23 110/76    Wt Readings from Last 3 Encounters:  08/08/23 159 lb (72.1 kg)  05/01/23 145 lb (65.8 kg)  04/12/23 141 lb (64 kg)    Ht 5' 7 (1.702 m)   BMI 24.90 kg/m   Physical Exam General: Speaking full sentences, no audible heavy breathing. Sounds alert and appropriately interactive.   Recent Labs     Component Value Date/Time   NA 143 04/12/2023 1456   NA 138 05/08/2011 1833    K 3.8 04/12/2023 1456   K 4.6 05/08/2011 1833   CL 106 04/12/2023 1456   CL 102 05/08/2011 1833   CO2 21 04/12/2023 1456   CO2 31 05/08/2011 1833   GLUCOSE 106 (H) 04/12/2023 1456   GLUCOSE 97 05/08/2011 1833   BUN 17 04/12/2023 1456   BUN 18 05/08/2011 1833   CREATININE 0.95 04/12/2023 1456   CREATININE 1.02 05/08/2011 1833   CALCIUM  9.7 04/12/2023 1456   CALCIUM  8.8 05/08/2011 1833   PROT 7.6 04/12/2023 1456   PROT 7.5 05/08/2011 1833   ALBUMIN 4.0 04/12/2023 1456   ALBUMIN 3.4 05/08/2011 1833   AST 16 04/12/2023 1456   AST 31 05/08/2011 1833   ALT 10 04/12/2023 1456   ALT 47 05/08/2011 1833   ALKPHOS 96 04/12/2023 1456   ALKPHOS 108 05/08/2011 1833   BILITOT 0.3 04/12/2023 1456   BILITOT 0.3 05/08/2011 1833   GFRNONAA >60 05/08/2011 1833   GFRAA >60 05/08/2011 1833    Lab Results  Component Value Date   WBC 4.9 05/01/2023   HGB 13.0 05/01/2023   HCT 39.8 05/01/2023   MCV 98 (H) 05/01/2023   PLT 270 05/01/2023   No results found for: HGBA1C Lab Results  Component Value Date   CHOL 182 07/10/2022   HDL 74 07/10/2022   LDLCALC 95 07/10/2022   TRIG 70 07/10/2022   CHOLHDL 2.5 07/10/2022   Lab Results  Component Value Date   TSH 1.190 07/10/2022     Assessment and Plan:  1. Depressive disorder (Primary) Follow-up with behavioral health as scheduled  2. Tobacco use disorder Patient is ready to attempt cessation.  Will check on her smoking cessation program referral  3. Non-seasonal allergic rhinitis due to pollen Montelukast  seems to have helped significantly, has refill available which she will be picking up today or tomorrow  4. Atherosclerosis of native coronary artery of native heart without angina pectoris Tolerating rosuvastatin  well after initiation of this medication last visit, continue as prescribed  5. Rectal bleeding Follow-up with GI as scheduled in roughly 2 weeks  6. Encounter for screening mammogram for breast cancer Reminded to  schedule mammogram and DEXA which can be scheduled for the same day  7. Abnormal x-ray of bone Reminded to schedule mammogram and DEXA which can be scheduled for the same day   Return in about 3 months (around 12/07/2023) for OV f/u chronic conditions.   I discussed the above assessment and treatment plan with the patient. The patient was provided an opportunity to ask questions and all were answered. The patient agreed with the plan and demonstrated an understanding of the instructions. The patient was advised to call back or seek an in-person evaluation if the symptoms worsen or if the condition fails to improve as anticipated. I provided a total time of 32 minutes inclusive of time utilized for medical chart review, information gathering, care coordination with staff, and documentation completion.  Rolan Hoyle, PA-C, DMSc, Nutritionist [facility] [phone]

## 2023-09-06 NOTE — Telephone Encounter (Signed)
 Called pt had her log in for the Mychart visit on her computer.  KP

## 2023-09-12 ENCOUNTER — Ambulatory Visit: Admitting: Psychiatry

## 2023-09-25 ENCOUNTER — Other Ambulatory Visit: Payer: Self-pay | Admitting: Physician Assistant

## 2023-09-25 DIAGNOSIS — I2510 Atherosclerotic heart disease of native coronary artery without angina pectoris: Secondary | ICD-10-CM

## 2023-09-25 DIAGNOSIS — I700 Atherosclerosis of aorta: Secondary | ICD-10-CM

## 2023-09-25 DIAGNOSIS — J301 Allergic rhinitis due to pollen: Secondary | ICD-10-CM

## 2023-09-25 MED ORDER — CHLORDIAZEPOXIDE HCL 25 MG PO CAPS
25.0000 mg | ORAL_CAPSULE | Freq: Three times a day (TID) | ORAL | 0 refills | Status: DC
Start: 2023-09-25 — End: 2023-10-24

## 2023-09-25 MED ORDER — MONTELUKAST SODIUM 10 MG PO TABS: 10.0000 mg | ORAL_TABLET | Freq: Every day | ORAL | 1 refills | Status: AC

## 2023-09-25 MED ORDER — ROSUVASTATIN CALCIUM 5 MG PO TABS: 5.0000 mg | ORAL_TABLET | Freq: Every day | ORAL | 1 refills | Status: DC

## 2023-09-25 NOTE — Telephone Encounter (Signed)
 Copied from CRM [id number]. Topic: Clinical - Medication Refill >> Sep 25, 2023  1:03 PM Emylou G wrote: Medication: chlordiazePOXIDE  (LIBRIUM ) 25 MG capsule  She rescheduled her mental health appt for 8/13 at 3pm Says she would like 3 a day.. 2 isn't working  Has the patient contacted their pharmacy? No (Agent: If no, request that the patient contact the pharmacy for the refill. If patient does not wish to contact the pharmacy document the reason why and proceed with request.) (Agent: If yes, when and what did the pharmacy advise?)  This is the patient's preferred pharmacy:  [id number], [place] - [address] [facility] [address] [place] [address] Phone: [phone] Fax: [phone]  Is this the correct pharmacy for this prescription? Yes If no, delete pharmacy and type the correct one.   Has the prescription been filled recently? No  Is the patient out of the medication? Yes  Has the patient been seen for an appointment in the last year OR does the patient have an upcoming appointment? Yes  Can we respond through MyChart? No  Agent: Please be advised that Rx refills may take up to 3 business days. We ask that you follow-up with your pharmacy.

## 2023-09-25 NOTE — Telephone Encounter (Signed)
 Copied from CRM [id number]. Topic: Clinical - Medication Refill >> Sep 25, 2023  1:00 PM Emylou G wrote: Medication: montelukast  (SINGULAIR ) 10 MG tablet rosuvastatin  (CRESTOR ) 5 MG tablet   Has the patient contacted their pharmacy? No (Agent: If no, request that the patient contact the pharmacy for the refill. If patient does not wish to contact the pharmacy document the reason why and proceed with request.) (Agent: If yes, when and what did the pharmacy advise?)  This is the patient's preferred pharmacy:  [facility] #[id number], [place] - [address] AT [facility] [address] [place] [address] Phone: [phone] Fax: [phone]  Is this the correct pharmacy for this prescription? Yes If no, delete pharmacy and type the correct one.   Has the prescription been filled recently? Yes  Is the patient out of the medication? No - will be on the 11th  Has the patient been seen for an appointment in the last year OR does the patient have an upcoming appointment? Yes  Can we respond through MyChart? No  Agent: Please be advised that Rx refills may take up to 3 business days. We ask that you follow-up with your pharmacy.

## 2023-09-26 ENCOUNTER — Other Ambulatory Visit: Payer: Self-pay | Admitting: Physician Assistant

## 2023-09-26 NOTE — Telephone Encounter (Unsigned)
 Copied from CRM [id number]. Topic: Clinical - Medication Refill >> Sep 26, 2023  2:29 PM Charlet HERO wrote: Medication: chlordiazePOXIDE  (LIBRIUM ) 25 MG capsule  No refills  This is the patient's preferred pharmacy:  [facility] #[id number], [place] - [address] AT [facility] [address] [facility] [place] [address] Phone: [phone] Fax: [phone]  Is this the correct pharmacy for this prescription? Yes If no, delete pharmacy and type the correct one.   Has the prescription been filled recently? Yes  Is the patient out of the medication? Yes  Has the patient been seen for an appointment in the last year OR does the patient have an upcoming appointment? Yes  Can we respond through MyChart? No  Agent: Please be advised that Rx refills may take up to 3 business days. We ask that you follow-up with your pharmacy.

## 2023-09-27 NOTE — Telephone Encounter (Signed)
 Requested medication (s) are due for refill today - no  Requested medication (s) are on the active medication list -yes  Future visit scheduled -yes  Last refill: 09/25/23 #60  Notes to clinic: duplicate request, non delegated Rx  Requested Prescriptions  Pending Prescriptions Disp Refills   chlordiazePOXIDE  (LIBRIUM ) 25 MG capsule 60 capsule 0    Sig: Take 1 capsule (25 mg total) by mouth 3 (three) times daily.     Not Delegated - Psychiatry:  Anxiolytics/Hypnotics - chlordiazepoxide  hydrochloride Failed - 09/27/2023 12:59 PM      Failed - This refill cannot be delegated      Failed - Urine Drug Screen completed in last 360 days      Passed - Cr in normal range and within 360 days    Creatinine  Date Value Ref Range Status  05/08/2011 1.02 0.60 - 1.30 mg/dL Final   Creatinine, Ser  Date Value Ref Range Status  04/12/2023 0.95 0.57 - 1.00 mg/dL Final         Passed - Valid encounter within last 6 months    Recent Outpatient Visits           3 weeks ago Depressive disorder   [place] [facility], [place], [place]   1 month ago Pulmonary emphysema, unspecified emphysema type [facility])   [place] [facility] at [facility], Toribio SQUIBB, [place]   4 months ago Thrombocytopenia [facility])   [facility] at [facility], Toribio SQUIBB, [place]   5 months ago Moderate protein-calorie malnutrition [facility])   [place] [facility] at [facility], Toribio SQUIBB, [place]                 Requested Prescriptions  Pending Prescriptions Disp Refills   chlordiazePOXIDE  (LIBRIUM ) 25 MG capsule 60 capsule 0    Sig: Take 1 capsule (25 mg total) by mouth 3 (three) times daily.     Not Delegated - Psychiatry:  Anxiolytics/Hypnotics - chlordiazepoxide  hydrochloride Failed - 09/27/2023 12:59 PM      Failed - This refill cannot be delegated      Failed - Urine Drug  Screen completed in last 360 days      Passed - Cr in normal range and within 360 days    Creatinine  Date Value Ref Range Status  05/08/2011 1.02 0.60 - 1.30 mg/dL Final   Creatinine, Ser  Date Value Ref Range Status  04/12/2023 0.95 0.57 - 1.00 mg/dL Final         Passed - Valid encounter within last 6 months    Recent Outpatient Visits           3 weeks ago Depressive disorder   [place] [facility] at [facility], [place], [place]   1 month ago Pulmonary emphysema, unspecified emphysema type [facility])   [place] [facility] at [facility], Toribio SQUIBB, [place]   4 months ago Thrombocytopenia [facility])   [facility] Medicine at [facility], Toribio SQUIBB, [place]   5 months ago Moderate protein-calorie malnutrition [facility])   [facility] Medicine at [facility], Toribio SQUIBB, [place]

## 2023-10-10 ENCOUNTER — Ambulatory Visit (INDEPENDENT_AMBULATORY_CARE_PROVIDER_SITE_OTHER): Admitting: Psychiatry

## 2023-10-10 ENCOUNTER — Encounter: Payer: Self-pay | Admitting: Psychiatry

## 2023-10-10 ENCOUNTER — Other Ambulatory Visit: Payer: Self-pay

## 2023-10-10 VITALS — BP 123/83 | HR 84 | Temp 97.9°F | Ht 67.0 in | Wt 163.4 lb

## 2023-10-10 DIAGNOSIS — F5105 Insomnia due to other mental disorder: Secondary | ICD-10-CM

## 2023-10-10 DIAGNOSIS — F99 Mental disorder, not otherwise specified: Secondary | ICD-10-CM | POA: Diagnosis not present

## 2023-10-10 DIAGNOSIS — F4310 Post-traumatic stress disorder, unspecified: Secondary | ICD-10-CM | POA: Diagnosis not present

## 2023-10-10 DIAGNOSIS — F419 Anxiety disorder, unspecified: Secondary | ICD-10-CM

## 2023-10-10 NOTE — Progress Notes (Signed)
 Psychiatric Initial Adult Assessment   Patient Identification: Natalie Hansen MRN:  [id number] Date of Evaluation:  10/10/2023 Referral Source: Toribio Hoyle PA Chief Complaint:   Chief Complaint  Patient presents with   Establish Care   Visit Diagnosis:    ICD-10-CM   1. PTSD (post-traumatic stress disorder)  F43.10     2. Anxiety disorder with panic attacks  F41.9     3. Insomnia due to other mental disorder  F51.05    F99       History of Present Illness: 53 year old female presenting [facility] for establishing care.  Patient has been referred by [facility] due to chronic benzo use.  Patient states that she needs to get a refill on her medications but reports that her previous provider Dr. Beverley at [facility] is no longer at the practice.  Patient reports that she has been having to take a Librium  25 mg 2 times a day for the last 6 months in which she was originally prescribed Librium  25 mg 3 times a day by Dr. Vicenta Clunes.  Patient presents today stating that she is in fear of benzo withdrawals and states that she is not sure how to be able to manage this without her previous provider.  Patient at this time is denying any kind of withdrawal symptoms stating that she is needing to get prescribed a benzodiazepine and would prefer Klonopin.  Patient was explained at [facility] that the only way to be treated is to be placed on a benzodiazepine taper with a goal of coming off the benzodiazepines altogether.  Patient initially was reluctant stating that she does not want to get off of benzos stating that she cannot stay on any other medications and when asked about what of the medication she has tried she states she would absolutely not take any other antidepressants.  Patient was educated on SSRIs as well as SNRIs and has been told that there are many different types of antidepressants and just because she had a bad experience on one antidepressant does not mean she will  have a bad experience on another.  Based on this information patient was recommended for [facility] psych testing to be able to determine next steps in regards of treatment.  Patient was educated on the importance of coming off of benzodiazepine as they were never meant for long-term use stating that with her current history patient has been diagnosed with benzo use disorder previously at [facility] in which she was involved with alcohol use and benzo use with suicidal ideation.  Patient has verbalized agreement that if she is to seek treatment at this facility that she understands that she will have to be placed on a benzodiazepine taper and that she will ultimately come off the medication will not be prescribed any more than directed according to the taper schedule.  Patient also verbalized understanding that she may have to explore other medications like SNRIs in order to manage her anxiety and is open to do that as well.  With the addition of [facility] testing, patient will be shared with Dr. Arfeen to determine if this provider can accept this case.  If unable to patient will be referred to a higher level of care to Dr. Eapen or Dr. Vickey at the [facility] clinic.    Based on this assessment interview is recommended for the patient to continue medications as prescribed.  See plan.  Patient is to continue recommendations until this provider speaks with  Dr. Arfeen to share the case.  Patient is in agreement with treatment plan.  Patient with no other questions or concerns at this time.  Patient has been scheduled for a 2-week follow-up.  This case will be discussed with Dr. Curry on 10/11/2023.  Associated Signs/Symptoms: Depression Symptoms: Negative (Hypo) Manic Symptoms:  Impulsivity, Irritable Mood, Anxiety Symptoms:  Excessive Worry, Panic Symptoms, Psychotic Symptoms: Negative PTSD Symptoms: Negative  Past Psychiatric History:  Previous Psych Hospitalizations: - Jan 2025, ER visit at [facility] Outpatient treatment:  - Current PCP Toribio Hoyle PA Medications Current: - Librium  25mg  BID for anxiety, planning to start Klonopin taper, sharing case with Dr. Arfeen.  -Trazadone 150mg  once daily, for insomnia.  Next Steps: - Share case with Dr. Arfeen and determine if management to be done by this provider or with Dr. Eappen or Dr. Vickey. Medication Trials: - Failure of prozac, fluoxetine, and lexapro - Previously on high dose on Xanax at 2mg  TID on 03/25/23 from [facility]. Suicide & Violence: - Denies SI/HI/AVH.  - Previous SI in 02/2023 Substance Use: - Previous history of cocaine and alcohol use disorder.  Psychotherapy: - Placed on waitlist for [place] at [place].  Legal:  - Denies Previous Psychotropic Medications: Yes   Substance Abuse History in the last 12 months:  No.  Consequences of Substance Abuse: Negative  Past Medical History:  Past Medical History:  Diagnosis Date   Allergies    Anxiety    Chronic bronchitis (HCC)    COPD (chronic obstructive pulmonary disease) (HCC)    GERD (gastroesophageal reflux disease)    Insomnia    PTSD (post-traumatic stress disorder)     Past Surgical History:  Procedure Laterality Date   ABDOMINAL HYSTERECTOMY  2005   uterus and cervix   SALPINGOOPHORECTOMY Left 2004   Benign ovarian cyst   SALPINGOOPHORECTOMY Right 2014   benign complex R ovarian cyst, irregular bleeding, pelvic pain, adhesions    Family Psychiatric History: No additional  Family History:  Family History  Problem Relation Age of Onset   Hypertension Mother    Hypertension Father    Cancer Maternal Grandmother     Social History:   Social History   Socioeconomic History   Marital status: Single    Spouse name: Not on file   Number of children: 0   Years of education: Not on file   Highest education level: 9th grade  Occupational History   Not on file  Tobacco Use   Smoking status: Every Day    Current packs/day: 1.00     Average packs/day: 1 pack/day for 43.0 years (43.0 ttl pk-yrs)    Types: Cigarettes   Smokeless tobacco: Never  Vaping Use   Vaping status: Never Used  Substance and Sexual Activity   Alcohol use: Not Currently   Drug use: Yes    Comment: cbd   Sexual activity: Yes    Birth control/protection: None  Other Topics Concern   Not on file  Social History Narrative   Not on file   Social Drivers of Health   Financial Resource Strain: Low Risk  (06/12/2022)   Overall Financial Resource Strain (CARDIA)    Difficulty of Paying Living Expenses: Not hard at all  Food Insecurity: No Food Insecurity (04/12/2023)   Hunger Vital Sign    Worried About Running Out of Food in the Last Year: Never true    Ran Out of Food in the Last Year: Never true  Transportation Needs:  No Transportation Needs (04/12/2023)   PRAPARE - Administrator, Civil Service (Medical): No    Lack of Transportation (Non-Medical): No  Physical Activity: Not on file  Stress: Not on file  Social Connections: Not on file    Additional Social History: No Additional  Allergies:   Allergies  Allergen Reactions   Prednisone Nausea Only    Severe nausea and vomiting   Tramadol Other (See Comments)    I about went crazy it didn't adjust with my system   Voltaren  [Diclofenac ] Nausea And Vomiting    Metabolic Disorder Labs: No results found for: HGBA1C, MPG No results found for: PROLACTIN Lab Results  Component Value Date   CHOL 182 07/10/2022   TRIG 70 07/10/2022   HDL 74 07/10/2022   CHOLHDL 2.5 07/10/2022   LDLCALC 95 07/10/2022   Lab Results  Component Value Date   TSH 1.190 07/10/2022    Therapeutic Level Labs: No results found for: LITHIUM No results found for: CBMZ No results found for: VALPROATE  Current Medications: Current Outpatient Medications  Medication Sig Dispense Refill   albuterol  (PROVENTIL) (2.5 MG/3ML) 0.083% nebulizer solution Inhale into the lungs.      chlordiazePOXIDE  (LIBRIUM ) 25 MG capsule Take 1 capsule (25 mg total) by mouth 3 (three) times daily. 60 capsule 0   FOLIC ACID PO Take by mouth.     montelukast  (SINGULAIR ) 10 MG tablet Take 1 tablet (10 mg total) by mouth at bedtime. 90 tablet 1   Multiple Vitamins-Minerals (CENTRUM SILVER PO) Take by mouth.     omeprazole  (PRILOSEC) 20 MG capsule Take 1 capsule (20 mg total) by mouth daily. 90 capsule 1   promethazine  (PHENERGAN ) 25 MG tablet Take 1 tablet (25 mg total) by mouth every 8 (eight) hours as needed for nausea or vomiting. 30 tablet 2   rosuvastatin  (CRESTOR ) 5 MG tablet Take 1 tablet (5 mg total) by mouth daily. 90 tablet 1   SYMBICORT  160-4.5 MCG/ACT inhaler INHALE 2 PUFFS INTO THE LUNGS IN THE MORNING AND 2 PUFFS AT BEDTIME FOR COPD MAINTENANCE THERAPY. RINSE MOUTH AFTER USE. 10.2 g 2   Thiamine HCl (VITAMIN B1 PO) Take by mouth.     traZODone  (DESYREL ) 150 MG tablet Take 1 tablet (150 mg total) by mouth at bedtime.     VENTOLIN  HFA 108 (90 Base) MCG/ACT inhaler Inhale 2 puffs into the lungs every 4 (four) hours as needed for wheezing or shortness of breath. 1 each 5   No current facility-administered medications for this visit.    Musculoskeletal: Strength & Muscle Tone: within normal limits Gait & Station: normal Patient leans: N/A  Psychiatric Specialty Exam: Review of Systems  Constitutional: Negative.   HENT: Negative.    Eyes: Negative.   Respiratory: Negative.    Cardiovascular: Negative.   Gastrointestinal: Negative.   Endocrine: Negative.   Genitourinary: Negative.   Musculoskeletal: Negative.   Skin: Negative.   Allergic/Immunologic: Negative.   Neurological: Negative.   Hematological: Negative.   Psychiatric/Behavioral:  Positive for behavioral problems and dysphoric mood. The patient is nervous/anxious.     Blood pressure 123/83, pulse 84, temperature 97.9 F (36.6 C), temperature source Temporal, height 5' 7 (1.702 m), weight 163 lb 6.4 oz (74.1  kg).Body mass index is 25.59 kg/m.  General Appearance: Well Groomed  Eye Contact:  Good  Speech:  Clear and Coherent  Volume:  Normal  Mood:  Anxious and Depressed  Affect:  Depressed  Thought Process:  Coherent  Orientation:  Full (Time, Place, and Person)  Thought Content:  Logical  Suicidal Thoughts:  No  Homicidal Thoughts:  No  Memory:  Immediate;   Good Recent;   Good Remote;   Good  Judgement:  Good  Insight:  Good  Psychomotor Activity:  Normal  Concentration:  Concentration: Good and Attention Span: Good  Recall:  Good  Fund of Knowledge:Good  Language: Good  Akathisia:  No  Handed:  Right  AIMS (if indicated):    Assets:  Desire for Improvement Financial Resources/Insurance Housing  ADL's:  Intact  Cognition: WNL  Sleep:  Good   Screenings: GAD-7    Flowsheet Row Video Visit from 09/06/2023 in [facility] at [facility] Office Visit from 08/08/2023 in [facility] Primary Care & [facility] Medicine at [facility] Office Visit from 04/12/2023 in [facility] Primary Care & Sports Medicine at [facility] Office Visit from 10/18/2022 in [facility] Primary Care & Sports Medicine at [facility] Office Visit from 09/13/2022 in [facility] Primary Care & Sports Medicine at [facility]  Total GAD-7 Score 6 6 13 7 6    PHQ2-9    Flowsheet Row Video Visit from 09/06/2023 in [facility] Primary Care & Sports Medicine at [facility] Office Visit from 08/08/2023 in [facility] Primary Care & [facility] Medicine at [facility] Office Visit from 04/12/2023 in [facility] Primary Care & Sports Medicine at [facility] Office Visit from 10/18/2022 in [facility] Office Visit from 09/13/2022 in [facility] Care & Sports Medicine at [facility]  PHQ-2 Total Score 0 1 1 3 4   PHQ-9 Total Score -- 7 -- 13 7   Flowsheet Row UC from 09/10/2022 in [facility] at  [facility]  UC from 05/22/2022 in [facility] at [facility]   C-SSRS RISK CATEGORY No Risk No Risk    Assessment and Plan:  Assessment - Diagnosis: Anxiety disorder with panic attacks [F41.9]  2. PTSD (post-traumatic stress disorder) [F43.10]  3. Insomnia due to other mental disorder [F51.05, F99]  Differential Diagnosis: Bipolar Disorder  - Progress: Baseline - Risk Factors: Worsening symptoms, benzodiazepine withdrawals.  Plan - Medications:  Continue taking Librium  25mg  BID for anxiety, with the plan of tapering. Pt has been educating on the tapering. Pt verbalized understanding.  Continue trazadone 150mg  once daily before bed, for insomnia.  - Psychotherapy: Pt will be placed on a waitlist for [facility] at [facility] - Education:  - Follow-Up:  - Referrals:  - Safety Planning:  The patient has been educated, if they should have suicidal thoughts with or without a plan to call 911, or go to the closest emergency department.  Pt verbalized understanding.  Pt denies firearms within the home.  Pt also agrees to call the clinic should they have worsening symptoms before the next appointment.     Patient/Guardian was advised Release of Information must be obtained prior to any record release in order to collaborate their care with an outside provider. Patient/Guardian was advised if they have not already done so to contact the registration department to sign all necessary forms in order for us  to release information regarding their care.   Consent: Patient/Guardian gives verbal consent for treatment and assignment of benefits for services provided during this visit. Patient/Guardian expressed understanding and agreed to proceed.   Dorn Jama Der, NP 8/13/20253:29 PM

## 2023-10-12 ENCOUNTER — Telehealth: Payer: Self-pay | Admitting: Psychiatry

## 2023-10-12 NOTE — Telephone Encounter (Signed)
 Patient left message on nurse line, she needed to reschedule her appointment for 10-24-23. Called number she verified at check in, and the mailbox is full.

## 2023-10-24 ENCOUNTER — Ambulatory Visit (INDEPENDENT_AMBULATORY_CARE_PROVIDER_SITE_OTHER): Admitting: Psychiatry

## 2023-10-24 ENCOUNTER — Encounter: Payer: Self-pay | Admitting: Psychiatry

## 2023-10-24 VITALS — BP 110/76 | HR 93 | Temp 98.4°F | Ht 67.0 in | Wt 172.6 lb

## 2023-10-24 DIAGNOSIS — F1320 Sedative, hypnotic or anxiolytic dependence, uncomplicated: Secondary | ICD-10-CM

## 2023-10-24 DIAGNOSIS — F5105 Insomnia due to other mental disorder: Secondary | ICD-10-CM | POA: Diagnosis not present

## 2023-10-24 DIAGNOSIS — F419 Anxiety disorder, unspecified: Secondary | ICD-10-CM

## 2023-10-24 DIAGNOSIS — F99 Mental disorder, not otherwise specified: Secondary | ICD-10-CM

## 2023-10-24 DIAGNOSIS — F4310 Post-traumatic stress disorder, unspecified: Secondary | ICD-10-CM

## 2023-10-24 MED ORDER — TRAZODONE HCL 150 MG PO TABS: ORAL_TABLET | ORAL | 2 refills | Status: DC

## 2023-10-24 MED ORDER — LAMOTRIGINE 25 MG PO TABS
25.0000 mg | ORAL_TABLET | Freq: Every day | ORAL | 2 refills | Status: DC
Start: 2023-10-24 — End: 2023-12-10

## 2023-10-24 MED ORDER — CLONAZEPAM 0.5 MG PO TABS
ORAL_TABLET | ORAL | 0 refills | Status: DC
Start: 2023-10-28 — End: 2023-11-21

## 2023-10-24 NOTE — Progress Notes (Signed)
 BH MD/PA/NP OP Progress Note  10/24/2023 11:31 AM Natalie Hansen  MRN:  [id number]  Chief Complaint:  Chief Complaint  Patient presents with   Follow-up   HPI: 23y old female presenting to [facility] for follow-up. Pt presents today with the understanding that she will be placed on a benzo taper. Pt reports she verbalized understanding and understands she will be treated with SNRI in the future of her anxiety management.  Pt reports that she came in today, feeling prepared and was happy to be established with a provider.  Pt asked about the medication regimen in which she was educated that she will start on the 1st of Sept, Klonopin  0.5mg  once daily in the AM and 0.25mg  once daily at night, for 2 months.  EBP show that a longer taper has shown reduced instance of withdrawals with consieration of her age, and how long she has been on benzos.  To manage anxiety symptoms during the taper, pt has been provided hydroxyzine 25mg  TID as needed for anxiety.  Pt also is being given trazadone for sleep 1-2 tablets of 150mg  as need for sleep.  Pt has also been educated on the use of magnesium glycinate and melatonin up to 5mg  once daily at night.  Pt verbalized understanding. Pt was educated on the schedule and provided education, with a print out of side effects to look out for.  Pt has been educated on how to call the clinic.  Pt also agrees to follow-up every two weeks.  Pt with no other questions or concerns.  Pt in in agreement of treatment plan.  Pt signed the benzo agreement with this provider.  Prescriptions have been schedule and will start Sept 1, 2025.   See plan.   I personally spent a total of 40 minutes in the care of the patient today including preparing to see the patient, getting/reviewing separately obtained history, performing a medically appropriate exam/evaluation, counseling and educating, placing orders, documenting clinical information in the EHR, and coordinating care.  Visit Diagnosis:     ICD-10-CM   1. Anxiety disorder with panic attacks  F41.9     2. PTSD (post-traumatic stress disorder)  F43.10     3. Insomnia due to other mental disorder  F51.05    F99     4. Benzodiazepine dependence (HCC)  F13.20       Past Psychiatric History:  Previous Psych Hospitalizations: - Jan 2025, ER visit at [facility] Outpatient treatment:  - Current PCP Toribio Hoyle PA Medications Current: - Klonopin  0.5mg  once in the morning, and 0.25 once in the evening for September and October. Pt signed benzo provider agreement form, scanned in chart.  -Hydroxyzine 25mg  TID as needed for anxiety.  -Trazadone 150mg  once daily, for insomnia.  Next Steps: - Share case with Dr. Arfeen and determine if management to be done by this provider or with Dr. Eappen or Dr. Vickey. Medication Trials: - Failure of prozac, fluoxetine, and lexapro - Previously on high dose on Xanax at 2mg  TID on 03/25/23 from [facility]. Suicide & Violence: - Denies SI/HI/AVH.  - Previous SI in 02/2023 Substance Use: - Previous history of cocaine and alcohol use disorder.  Psychotherapy: - Placed on waitlist for [place] at [place].  Legal:  - Denies  Past Medical History:  Past Medical History:  Diagnosis Date   Allergies    Anxiety    Chronic bronchitis (HCC)    COPD (chronic obstructive pulmonary disease) (HCC)    GERD (  gastroesophageal reflux disease)    Insomnia    PTSD (post-traumatic stress disorder)     Past Surgical History:  Procedure Laterality Date   ABDOMINAL HYSTERECTOMY  2005   uterus and cervix   SALPINGOOPHORECTOMY Left 2004   Benign ovarian cyst   SALPINGOOPHORECTOMY Right 2014   benign complex R ovarian cyst, irregular bleeding, pelvic pain, adhesions    Family Psychiatric History: No additional  Family History:  Family History  Problem Relation Age of Onset   Hypertension Mother    Hypertension Father    Cancer Maternal Grandmother     Social History:  Social History    Socioeconomic History   Marital status: Single    Spouse name: Not on file   Number of children: 0   Years of education: Not on file   Highest education level: 9th grade  Occupational History   Not on file  Tobacco Use   Smoking status: Every Day    Current packs/day: 1.00    Average packs/day: 1 pack/day for 43.0 years (43.0 ttl pk-yrs)    Types: Cigarettes   Smokeless tobacco: Never  Vaping Use   Vaping status: Never Used  Substance and Sexual Activity   Alcohol use: Not Currently   Drug use: Yes    Comment: cbd   Sexual activity: Yes    Birth control/protection: None  Other Topics Concern   Not on file  Social History Narrative   Not on file   Social Drivers of Health   Financial Resource Strain: Low Risk  (06/12/2022)   Overall Financial Resource Strain (CARDIA)    Difficulty of Paying Living Expenses: Not hard at all  Food Insecurity: No Food Insecurity (04/12/2023)   Hunger Vital Sign    Worried About Running Out of Food in the Last Year: Never true    Ran Out of Food in the Last Year: Never true  Transportation Needs: No Transportation Needs (04/12/2023)   PRAPARE - Administrator, Civil Service (Medical): No    Lack of Transportation (Non-Medical): No  Physical Activity: Not on file  Stress: Not on file  Social Connections: Not on file    Allergies:  Allergies  Allergen Reactions   Prednisone Nausea Only    Severe nausea and vomiting   Tramadol Other (See Comments)    I about went crazy it didn't adjust with my system   Voltaren  [Diclofenac ] Nausea And Vomiting    Metabolic Disorder Labs: No results found for: HGBA1C, MPG No results found for: PROLACTIN Lab Results  Component Value Date   CHOL 182 07/10/2022   TRIG 70 07/10/2022   HDL 74 07/10/2022   CHOLHDL 2.5 07/10/2022   LDLCALC 95 07/10/2022   Lab Results  Component Value Date   TSH 1.190 07/10/2022    Therapeutic Level Labs: No results found for: LITHIUM No  results found for: VALPROATE No results found for: CBMZ  Current Medications: Current Outpatient Medications  Medication Sig Dispense Refill   albuterol  (PROVENTIL) (2.5 MG/3ML) 0.083% nebulizer solution Inhale into the lungs.     chlordiazePOXIDE  (LIBRIUM ) 25 MG capsule Take 1 capsule (25 mg total) by mouth 3 (three) times daily. 60 capsule 0   FOLIC ACID PO Take by mouth.     montelukast  (SINGULAIR ) 10 MG tablet Take 1 tablet (10 mg total) by mouth at bedtime. 90 tablet 1   Multiple Vitamins-Minerals (CENTRUM SILVER PO) Take by mouth.     omeprazole  (PRILOSEC) 20 MG capsule Take 1 capsule (  20 mg total) by mouth daily. 90 capsule 1   promethazine  (PHENERGAN ) 25 MG tablet Take 1 tablet (25 mg total) by mouth every 8 (eight) hours as needed for nausea or vomiting. 30 tablet 2   rosuvastatin  (CRESTOR ) 5 MG tablet Take 1 tablet (5 mg total) by mouth daily. 90 tablet 1   SYMBICORT  160-4.5 MCG/ACT inhaler INHALE 2 PUFFS INTO THE LUNGS IN THE MORNING AND 2 PUFFS AT BEDTIME FOR COPD MAINTENANCE THERAPY. RINSE MOUTH AFTER USE. 10.2 g 2   Thiamine HCl (VITAMIN B1 PO) Take by mouth.     traZODone  (DESYREL ) 150 MG tablet Take 1 tablet (150 mg total) by mouth at bedtime.     VENTOLIN  HFA 108 (90 Base) MCG/ACT inhaler Inhale 2 puffs into the lungs every 4 (four) hours as needed for wheezing or shortness of breath. 1 each 5   No current facility-administered medications for this visit.     Musculoskeletal: Strength & Muscle Tone: within normal limits Gait & Station: normal Patient leans: N/A  Psychiatric Specialty Exam: Review of Systems  Constitutional: Negative.   HENT: Negative.    Eyes: Negative.   Respiratory: Negative.    Cardiovascular: Negative.   Gastrointestinal: Negative.   Endocrine: Negative.   Genitourinary: Negative.   Musculoskeletal: Negative.   Skin: Negative.   Allergic/Immunologic: Negative.   Neurological: Negative.   Hematological: Negative.    Psychiatric/Behavioral:  Positive for behavioral problems and dysphoric mood. The patient is nervous/anxious.     Blood pressure 110/76, pulse 93, temperature 98.4 F (36.9 C), temperature source Temporal, height 5' 7 (1.702 m), weight 172 lb 9.6 oz (78.3 kg), SpO2 95%.Body mass index is 27.03 kg/m.  General Appearance: Well Groomed  Eye Contact:  Good  Speech:  Clear and Coherent  Volume:  Normal  Mood:  Anxious  Affect:  Appropriate  Thought Process:  Coherent  Orientation:  Full (Time, Place, and Person)  Thought Content: Logical   Suicidal Thoughts:  No  Homicidal Thoughts:  No  Memory:  Immediate;   Good Recent;   Good Remote;   Good  Judgement:  Good  Insight:  Fair  Psychomotor Activity:  Normal  Concentration:  Concentration: Good and Attention Span: Good  Recall:  Good  Fund of Knowledge: Good  Language: Good  Akathisia:  No  Handed:  Right  AIMS (if indicated):   Assets:  Desire for Improvement Financial Resources/Insurance Housing  ADL's:  Intact  Cognition: WNL  Sleep:  Good   Screenings: GAD-7    Flowsheet Row Office Visit from 10/10/2023 in [facility] Video Visit from 09/06/2023 in [facility] at [facility] Office Visit from 08/08/2023 in [facility] & [facility] Medicine at [facility] Office Visit from 04/12/2023 in [facility] at [facility] Office Visit from 10/18/2022 in [facility] Care & Sports Medicine at [facility]  Total GAD-7 Score 9 6 6 13 7    PHQ2-9    Flowsheet Row Office Visit from 10/10/2023 in [facility] Video Visit from 09/06/2023 in [facility] & [facility] Medicine at [facility] Office Visit from 08/08/2023 in [facility] Primary Care & Sports Medicine at [facility] Office Visit from 04/12/2023 in [facility] Primary Care & Sports Medicine at  [facility] Office Visit from 10/18/2022 in [facility] Care & Sports Medicine at [facility] Total Score 1  0 1 1 3   PHQ-9 Total Score -- -- 7 -- 13   Flowsheet Row UC from 09/10/2022 in [facility] at [facility]  UC from 05/22/2022 in [facility] at [facility]   C-SSRS RISK CATEGORY No Risk No Risk     Assessment and Plan:  Assessment - Diagnosis: Anxiety disorder with panic attacks [F41.9]  2. PTSD (post-traumatic stress disorder) [F43.10]  3. Insomnia due to other mental disorder [F51.05, F99]  Differential Diagnosis: Bipolar Disorder  - Progress: Baseline - Risk Factors: Worsening symptoms, benzodiazepine withdrawals.  Plan - Medications:  Start Klonopin  0.5mg  once daily in the morning, and 0.25mg  once daily in the evening. Pt currently on a Klonopin  taper.   Continue trazadone 150mg  once daily before bed, for insomnia.  Start Hydroxyzine 25mg  once daily TID as needed for anxiety TID.   Recommended to take Magnesium glycinate and melatonin once daily before bed. - Psychotherapy: Pt will be placed on a waitlist for [facility] at [facility] - Education: Pt has been educated on how to reach this provider by calling the clinic or utilizing [facility].  Pt also educated on medication, purpose, side effects, adverse reactions, and dosage.  - Follow-Up: Pt to follow-up in 2 weeks.  - Referrals: Therapy [facility].  - Safety Planning:  The patient has been educated, if they should have suicidal thoughts with or without a plan to call 911, or go to the closest emergency department.  Pt verbalized understanding.  Pt denies firearms within the home.  Pt also agrees to call the clinic should they have worsening symptoms before the next appointment.  Patient/Guardian was advised Release of Information must be obtained prior to any record release in order to collaborate their care with an outside provider. Patient/Guardian was advised if they have not already  done so to contact the registration department to sign all necessary forms in order for us  to release information regarding their care.   Consent: Patient/Guardian gives verbal consent for treatment and assignment of benefits for services provided during this visit. Patient/Guardian expressed understanding and agreed to proceed.    Dorn Jama Der, NP 10/24/2023, 11:31 AM

## 2023-11-07 ENCOUNTER — Telehealth: Payer: Self-pay

## 2023-11-07 ENCOUNTER — Telehealth: Payer: Self-pay | Admitting: Physician Assistant

## 2023-11-07 ENCOUNTER — Ambulatory Visit: Payer: Self-pay

## 2023-11-07 ENCOUNTER — Ambulatory Visit: Admitting: Psychiatry
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
## 2023-11-07 NOTE — Telephone Encounter (Signed)
 Noted I spoke to pt.  KP

## 2023-11-12 ENCOUNTER — Ambulatory Visit
Admit: 2023-11-12 | Discharge: 2023-11-13 | Payer: Medicaid (Managed Care) | Attending: Student in an Organized Health Care Education/Training Program | Primary: Student in an Organized Health Care Education/Training Program

## 2023-11-21 ENCOUNTER — Encounter: Payer: Self-pay | Admitting: Psychiatry

## 2023-11-21 ENCOUNTER — Other Ambulatory Visit: Payer: Self-pay

## 2023-11-21 ENCOUNTER — Ambulatory Visit (INDEPENDENT_AMBULATORY_CARE_PROVIDER_SITE_OTHER): Admitting: Psychiatry

## 2023-11-21 VITALS — BP 109/79 | HR 100 | Temp 97.6°F | Ht 67.0 in | Wt 167.2 lb

## 2023-11-21 DIAGNOSIS — F4310 Post-traumatic stress disorder, unspecified: Secondary | ICD-10-CM | POA: Diagnosis not present

## 2023-11-21 DIAGNOSIS — F1320 Sedative, hypnotic or anxiolytic dependence, uncomplicated: Secondary | ICD-10-CM

## 2023-11-21 DIAGNOSIS — F5105 Insomnia due to other mental disorder: Secondary | ICD-10-CM | POA: Diagnosis not present

## 2023-11-21 DIAGNOSIS — F419 Anxiety disorder, unspecified: Secondary | ICD-10-CM

## 2023-11-21 DIAGNOSIS — F99 Mental disorder, not otherwise specified: Secondary | ICD-10-CM

## 2023-11-21 MED ORDER — CLONAZEPAM 0.5 MG PO TABS: ORAL_TABLET | ORAL | 0 refills | Status: DC
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
## 2023-12-10 ENCOUNTER — Ambulatory Visit (INDEPENDENT_AMBULATORY_CARE_PROVIDER_SITE_OTHER): Admitting: Physician Assistant

## 2023-12-10 ENCOUNTER — Encounter: Payer: Self-pay | Admitting: Physician Assistant

## 2023-12-10 ENCOUNTER — Ambulatory Visit: Admitting: Physician Assistant

## 2023-12-10 VITALS — BP 118/78 | HR 99 | Temp 98.3°F | Ht 67.0 in | Wt 169.0 lb

## 2023-12-10 DIAGNOSIS — R002 Palpitations: Secondary | ICD-10-CM

## 2023-12-10 DIAGNOSIS — Z23 Encounter for immunization: Secondary | ICD-10-CM | POA: Diagnosis not present

## 2023-12-10 DIAGNOSIS — R0789 Other chest pain: Secondary | ICD-10-CM | POA: Diagnosis not present

## 2023-12-10 DIAGNOSIS — T7491XD Unspecified adult maltreatment, confirmed, subsequent encounter: Secondary | ICD-10-CM | POA: Insufficient documentation

## 2023-12-10 DIAGNOSIS — J449 Chronic obstructive pulmonary disease, unspecified: Secondary | ICD-10-CM

## 2023-12-10 DIAGNOSIS — R112 Nausea with vomiting, unspecified: Secondary | ICD-10-CM

## 2023-12-10 DIAGNOSIS — I2510 Atherosclerotic heart disease of native coronary artery without angina pectoris: Secondary | ICD-10-CM

## 2023-12-10 DIAGNOSIS — M25512 Pain in left shoulder: Secondary | ICD-10-CM

## 2023-12-10 DIAGNOSIS — G8929 Other chronic pain: Secondary | ICD-10-CM | POA: Insufficient documentation

## 2023-12-10 MED ORDER — METOPROLOL TARTRATE 25 MG PO TABS: 12.5000 mg | ORAL_TABLET | Freq: Two times a day (BID) | ORAL | 1 refills | Status: DC | PRN

## 2023-12-10 MED ORDER — SYMBICORT 160-4.5 MCG/ACT IN AERO
2.0000 | INHALATION_SPRAY | Freq: Two times a day (BID) | RESPIRATORY_TRACT | 5 refills | Status: AC
Start: 2023-12-10 — End: ?

## 2023-12-10 MED ORDER — VENTOLIN HFA 108 (90 BASE) MCG/ACT IN AERS: 2.0000 | INHALATION_SPRAY | RESPIRATORY_TRACT | 5 refills | Status: AC | PRN

## 2023-12-10 MED ORDER — PROMETHAZINE HCL 25 MG PO TABS: 25.0000 mg | ORAL_TABLET | Freq: Three times a day (TID) | ORAL | 2 refills | Status: AC | PRN

## 2023-12-10 MED ORDER — ROSUVASTATIN CALCIUM 5 MG PO TABS
5.0000 mg | ORAL_TABLET | Freq: Every day | ORAL | 1 refills | Status: AC
Start: 2023-12-10 — End: ?
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
## 2023-12-14 ENCOUNTER — Ambulatory Visit: Admitting: Family Medicine

## 2023-12-19 ENCOUNTER — Encounter: Payer: Self-pay | Admitting: Psychiatry

## 2023-12-19 ENCOUNTER — Ambulatory Visit (INDEPENDENT_AMBULATORY_CARE_PROVIDER_SITE_OTHER): Admitting: Psychiatry

## 2023-12-19 ENCOUNTER — Other Ambulatory Visit: Payer: Self-pay

## 2023-12-19 VITALS — BP 96/63 | HR 66 | Temp 97.4°F | Ht 67.0 in | Wt 175.2 lb

## 2023-12-19 DIAGNOSIS — F4310 Post-traumatic stress disorder, unspecified: Secondary | ICD-10-CM | POA: Diagnosis not present

## 2023-12-19 DIAGNOSIS — F419 Anxiety disorder, unspecified: Secondary | ICD-10-CM | POA: Diagnosis not present

## 2023-12-19 DIAGNOSIS — F1320 Sedative, hypnotic or anxiolytic dependence, uncomplicated: Secondary | ICD-10-CM

## 2023-12-19 MED ORDER — VENLAFAXINE HCL ER 37.5 MG PO CP24: 37.5000 mg | ORAL_CAPSULE | Freq: Every day | ORAL | 0 refills | Status: DC

## 2023-12-19 MED ORDER — CLONAZEPAM 0.5 MG PO TABS: ORAL_TABLET | ORAL | 0 refills | Status: AC
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
## 2023-12-24 ENCOUNTER — Ambulatory Visit: Admitting: Family Medicine

## 2023-12-31 ENCOUNTER — Telehealth: Payer: Self-pay | Admitting: Psychiatry

## 2023-12-31 ENCOUNTER — Other Ambulatory Visit: Payer: Self-pay | Admitting: Psychiatry

## 2023-12-31 DIAGNOSIS — F5105 Insomnia due to other mental disorder: Secondary | ICD-10-CM

## 2023-12-31 DIAGNOSIS — F419 Anxiety disorder, unspecified: Secondary | ICD-10-CM

## 2023-12-31 DIAGNOSIS — F1320 Sedative, hypnotic or anxiolytic dependence, uncomplicated: Secondary | ICD-10-CM

## 2023-12-31 MED ORDER — TRAZODONE HCL 150 MG PO TABS: ORAL_TABLET | ORAL | 2 refills | Status: AC

## 2023-12-31 MED ORDER — VENLAFAXINE HCL ER 37.5 MG PO CP24: 37.5000 mg | ORAL_CAPSULE | Freq: Every day | ORAL | 0 refills | Status: AC

## 2024-01-03 ENCOUNTER — Telehealth: Payer: Self-pay

## 2024-01-03 ENCOUNTER — Telehealth: Payer: Self-pay | Admitting: Psychiatry

## 2024-01-03 NOTE — Telephone Encounter (Signed)
 Spoke with patient via phone. She just wanted to give me an update. I asked her if she wanted Valee removed from her contact list but she said no. She was asking about clonazepam  which I believe is written by her psychiatrist - advised that psych would control this outpatient and hospitalist would control inpatient.   For the record, given that her fracture was an alcohol-related fall as proven by BAC on admission in this patient with known alcohol and substance use disorder, I do question the ongoing use of clonazepam  and the potential fall risk it carries.SABRASABRA

## 2024-01-03 NOTE — Telephone Encounter (Signed)
 Copied from CRM [id number]. Topic: Clinical - Medical Advice >> Jan 03, 2024 10:41 AM Sophia H wrote: Reason for CRM: Patient would like a call back from PCP - states she is currently in the hospital. Had one operation already and is supposed to have another next week. Broken ankle in 3 places, states it is easier for her to speak using the hospital phone # [phone], 11th floor room D33  ** Patient states if Valee Taylor comes to clinic and tries to ask questions/relay information on her behalf to please disregard. TY

## 2024-01-03 NOTE — Telephone Encounter (Signed)
 Psych has been attempting BZD taper.

## 2024-01-03 NOTE — Telephone Encounter (Signed)
 Received fax from patients pharmacy requesting  a refill of clonazePAM  (KLONOPIN ) 0.5 MG tablet   Last visit 12-19-23 Next visit 01-16-24      Preferred Pharmacies   [id number] [place] - [address] AT [facility] [address] [facility] [address] Phone: [phone]  Fax: [phone]

## 2024-01-15 ENCOUNTER — Telehealth: Payer: Self-pay | Admitting: Psychiatry

## 2024-01-15 NOTE — Telephone Encounter (Signed)
 Patient had to cancel her appointment on 01-16-24 due to breaking her ankle in 3 places. Currently in hospital but will be released in 2 days which she will have to go stay in [facility] until she can find her own place. She has left her boyfriend. States she will need a refill on clonazepam  which the hospital increased due to her stress. She cannot get to her medication at home. Please call and advise

## 2024-01-16 ENCOUNTER — Ambulatory Visit: Admitting: Psychiatry

## 2024-01-17 ENCOUNTER — Other Ambulatory Visit: Payer: Self-pay | Admitting: Psychiatry
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
## 2024-02-21 ENCOUNTER — Other Ambulatory Visit: Payer: Self-pay | Admitting: Physician Assistant

## 2024-02-21 DIAGNOSIS — R002 Palpitations: Secondary | ICD-10-CM

## 2024-02-22 NOTE — Telephone Encounter (Signed)
 Requested Prescriptions  Pending Prescriptions Disp Refills   metoprolol  tartrate (LOPRESSOR ) 25 MG tablet [Pharmacy Med Name: METOPROLOL  TARTRATE 25MG  TABLETS] 90 tablet 0    Sig: TAKE 1/2 TO 1 TABLET(12.5 TO 25 MG) BY MOUTH TWICE DAILY AS NEEDED     Cardiovascular:  Beta Blockers Passed - 02/22/2024  5:58 PM      Passed - Last BP in normal range    BP Readings from Last 1 Encounters:  12/10/23 118/78         Passed - Last Heart Rate in normal range    Pulse Readings from Last 1 Encounters:  12/10/23 99         Passed - Valid encounter within last 6 months    Recent Outpatient Visits           2 months ago Muscular chest pain   [place] [facility] Medicine at [facility], Toribio SQUIBB, [place]   5 months ago Depressive disorder   [facility] Primary Care & Sports Medicine at [facility], Toribio [place]QU[place], [place]   6 months ago Pulmonary emphysema, unspecified emphysema type   [facility] Care & [facility] Medicine at [facility], Toribio [place]QU[place]   9 months ago Thrombocytopenia   [facility] Primary Care & Sports Medicine at [facility], Toribio SQUIBB, [place]   10 months ago Moderate protein-calorie malnutrition   [facility] Care & Sports Medicine at [facility], Toribio SQUIBB, [place]

## 2024-03-05 ENCOUNTER — Telehealth: Payer: Self-pay | Admitting: Physician Assistant

## 2024-03-05 NOTE — Telephone Encounter (Signed)
 Called pt she wanted Dan to prescribe her pain medication. Told her Rolan does not prescribe dilaudid in any circumstances. Told pt she needs an appt to see if any medication can be sent in to help. Pt has appt on 03/12/24. Pt verbalized understanding.  KP

## 2024-03-05 NOTE — Telephone Encounter (Signed)
 Copied from CRM #[id number]. Topic: General - Other >> Mar 05, 2024 11:10 AM Zebedee SAUNDERS wrote: Reason for CRM: Pt would like to speak with the nurse regarding her follow up appt. Pt would not elaborate. Please call pt at [phone].

## 2024-03-09 ENCOUNTER — Other Ambulatory Visit: Payer: Self-pay | Admitting: Physician Assistant

## 2024-03-09 DIAGNOSIS — M79602 Pain in left arm: Secondary | ICD-10-CM

## 2024-03-11 NOTE — Telephone Encounter (Signed)
 Discontinued 10/18/22, patient preference.  Requested Prescriptions  Pending Prescriptions Disp Refills   gabapentin  (NEURONTIN ) 300 MG capsule [Pharmacy Med Name: GABAPENTIN  300MG  CAPSULES] 90 capsule 1    Sig: ON DAY 1 TAKE 1 CAPSULE BY MOUTH. INCREASE TO 2 CAPSULES ON DAY 2. INCREASE TO 3 CAPSULES ON DAY 3 AND THEREAFTER. MAY CAUSE DROWSINESS     Neurology: Anticonvulsants - gabapentin  Passed - 03/11/2024  8:55 AM      Passed - Cr in normal range and within 360 days    Creatinine  Date Value Ref Range Status  05/08/2011 1.02 0.60 - 1.30 mg/dL Final   Creatinine, Ser  Date Value Ref Range Status  04/12/2023 0.95 0.57 - 1.00 mg/dL Final         Passed - Completed PHQ-2 or PHQ-9 in the last 360 days      Passed - Valid encounter within last 12 months    Recent Outpatient Visits           3 months ago Muscular chest pain   [place] [facility] Care & Sports Medicine at [facility], Toribio [place]QUIBB, [place]   6 months ago Depressive disorder   [facility] Primary Care & Sports Medicine at [facility], Toribio SQUIBB, [place]   7 months ago Pulmonary emphysema, unspecified emphysema type   [facility] Primary Care & Sports Medicine at [facility], [place]   10 months ago Thrombocytopenia   [facility] Primary Care & Sports Medicine at [facility], Toribio SQUIBB, [place]   11 months ago Moderate protein-calorie malnutrition   [facility] Primary Care & Sports Medicine at [facility], Toribio SQUIBB, [place]

## 2024-03-12 ENCOUNTER — Ambulatory Visit: Admitting: Physician Assistant

## 2024-03-12 ENCOUNTER — Inpatient Hospital Stay: Admitting: Physician Assistant
